# Patient Record
Sex: Male | Born: 1945 | Race: White | Hispanic: No | Marital: Married | Smoking: Never smoker
Health system: Southern US, Community
[De-identification: ages and names within clinical notes are randomized; demographics above are authoritative.]

## PROBLEM LIST (undated history)

## (undated) DIAGNOSIS — K760 Fatty (change of) liver, not elsewhere classified: Secondary | ICD-10-CM

## (undated) DIAGNOSIS — I10 Essential (primary) hypertension: Secondary | ICD-10-CM

## (undated) DIAGNOSIS — N400 Enlarged prostate without lower urinary tract symptoms: Secondary | ICD-10-CM

## (undated) DIAGNOSIS — E7800 Pure hypercholesterolemia, unspecified: Secondary | ICD-10-CM

## (undated) HISTORY — PX: APPENDECTOMY: SHX54

---

## 1998-02-16 ENCOUNTER — Ambulatory Visit (HOSPITAL_COMMUNITY): Admission: RE | Admit: 1998-02-16 | Discharge: 1998-02-16 | Payer: Self-pay | Admitting: Internal Medicine

## 1998-02-16 ENCOUNTER — Encounter: Payer: Self-pay | Admitting: Internal Medicine

## 2002-08-09 ENCOUNTER — Encounter: Admission: RE | Admit: 2002-08-09 | Discharge: 2002-08-09 | Payer: Self-pay | Admitting: Family Medicine

## 2002-08-09 ENCOUNTER — Encounter: Payer: Self-pay | Admitting: Family Medicine

## 2014-04-05 ENCOUNTER — Other Ambulatory Visit: Payer: Self-pay | Admitting: Family Medicine

## 2014-04-05 DIAGNOSIS — Z87891 Personal history of nicotine dependence: Secondary | ICD-10-CM

## 2014-04-05 DIAGNOSIS — Z136 Encounter for screening for cardiovascular disorders: Secondary | ICD-10-CM

## 2014-04-14 ENCOUNTER — Ambulatory Visit
Admission: RE | Admit: 2014-04-14 | Discharge: 2014-04-14 | Disposition: A | Discharge status: Home / Self Care | Payer: Self-pay | Source: Ambulatory Visit | Attending: Family Medicine | Admitting: Family Medicine

## 2014-04-14 ENCOUNTER — Encounter (INDEPENDENT_AMBULATORY_CARE_PROVIDER_SITE_OTHER): Payer: Self-pay

## 2014-04-14 DIAGNOSIS — Z136 Encounter for screening for cardiovascular disorders: Secondary | ICD-10-CM

## 2014-04-14 DIAGNOSIS — Z87891 Personal history of nicotine dependence: Secondary | ICD-10-CM

## 2015-10-02 ENCOUNTER — Encounter: Payer: Self-pay | Admitting: Gastroenterology

## 2016-09-08 DIAGNOSIS — R1011 Right upper quadrant pain: Secondary | ICD-10-CM | POA: Diagnosis not present

## 2016-09-10 ENCOUNTER — Encounter (HOSPITAL_COMMUNITY): Payer: Self-pay | Admitting: Emergency Medicine

## 2016-09-10 ENCOUNTER — Other Ambulatory Visit: Payer: Self-pay | Admitting: Family Medicine

## 2016-09-10 ENCOUNTER — Inpatient Hospital Stay (HOSPITAL_COMMUNITY)
Admission: EM | Admit: 2016-09-10 | Discharge: 2016-09-12 | DRG: 419 | Disposition: A | Discharge status: Home / Self Care | Payer: MEDICARE | Attending: Surgery | Admitting: Surgery

## 2016-09-10 ENCOUNTER — Ambulatory Visit
Admission: RE | Admit: 2016-09-10 | Discharge: 2016-09-10 | Disposition: A | Discharge status: Home / Self Care | Payer: Medicare HMO | Source: Ambulatory Visit | Attending: Family Medicine | Admitting: Family Medicine

## 2016-09-10 DIAGNOSIS — K660 Peritoneal adhesions (postprocedural) (postinfection): Secondary | ICD-10-CM | POA: Diagnosis present

## 2016-09-10 DIAGNOSIS — K8020 Calculus of gallbladder without cholecystitis without obstruction: Secondary | ICD-10-CM | POA: Diagnosis not present

## 2016-09-10 DIAGNOSIS — R402252 Coma scale, best verbal response, oriented, at arrival to emergency department: Secondary | ICD-10-CM | POA: Diagnosis not present

## 2016-09-10 DIAGNOSIS — K8012 Calculus of gallbladder with acute and chronic cholecystitis without obstruction: Secondary | ICD-10-CM | POA: Diagnosis not present

## 2016-09-10 DIAGNOSIS — K5900 Constipation, unspecified: Secondary | ICD-10-CM | POA: Diagnosis not present

## 2016-09-10 DIAGNOSIS — K760 Fatty (change of) liver, not elsewhere classified: Secondary | ICD-10-CM | POA: Diagnosis not present

## 2016-09-10 DIAGNOSIS — R1011 Right upper quadrant pain: Secondary | ICD-10-CM

## 2016-09-10 DIAGNOSIS — K810 Acute cholecystitis: Secondary | ICD-10-CM | POA: Diagnosis not present

## 2016-09-10 DIAGNOSIS — Z419 Encounter for procedure for purposes other than remedying health state, unspecified: Secondary | ICD-10-CM

## 2016-09-10 DIAGNOSIS — R402142 Coma scale, eyes open, spontaneous, at arrival to emergency department: Secondary | ICD-10-CM | POA: Diagnosis not present

## 2016-09-10 DIAGNOSIS — E7800 Pure hypercholesterolemia, unspecified: Secondary | ICD-10-CM

## 2016-09-10 DIAGNOSIS — K8000 Calculus of gallbladder with acute cholecystitis without obstruction: Secondary | ICD-10-CM

## 2016-09-10 DIAGNOSIS — I10 Essential (primary) hypertension: Secondary | ICD-10-CM | POA: Diagnosis not present

## 2016-09-10 DIAGNOSIS — Z9049 Acquired absence of other specified parts of digestive tract: Secondary | ICD-10-CM

## 2016-09-10 DIAGNOSIS — N400 Enlarged prostate without lower urinary tract symptoms: Secondary | ICD-10-CM | POA: Diagnosis not present

## 2016-09-10 DIAGNOSIS — R402362 Coma scale, best motor response, obeys commands, at arrival to emergency department: Secondary | ICD-10-CM | POA: Diagnosis present

## 2016-09-10 HISTORY — DX: Benign prostatic hyperplasia without lower urinary tract symptoms: N40.0

## 2016-09-10 HISTORY — DX: Pure hypercholesterolemia, unspecified: E78.00

## 2016-09-10 HISTORY — DX: Essential (primary) hypertension: I10

## 2016-09-10 HISTORY — DX: Fatty (change of) liver, not elsewhere classified: K76.0

## 2016-09-10 LAB — URINALYSIS, ROUTINE W REFLEX MICROSCOPIC
Bilirubin Urine: NEGATIVE
Glucose, UA: NEGATIVE mg/dL
Hgb urine dipstick: NEGATIVE
Ketones, ur: NEGATIVE mg/dL
Leukocytes, UA: NEGATIVE
Nitrite: NEGATIVE
Protein, ur: NEGATIVE mg/dL
Specific Gravity, Urine: 1.027 (ref 1.005–1.030)
pH: 5 (ref 5.0–8.0)

## 2016-09-10 LAB — COMPREHENSIVE METABOLIC PANEL
ALT: 30 U/L (ref 17–63)
AST: 32 U/L (ref 15–41)
Albumin: 4.1 g/dL (ref 3.5–5.0)
Alkaline Phosphatase: 81 U/L (ref 38–126)
Anion gap: 8 (ref 5–15)
BUN: 31 mg/dL — ABNORMAL HIGH (ref 6–20)
CO2: 28 mmol/L (ref 22–32)
Calcium: 9.1 mg/dL (ref 8.9–10.3)
Chloride: 98 mmol/L — ABNORMAL LOW (ref 101–111)
Creatinine, Ser: 1.24 mg/dL (ref 0.61–1.24)
GFR calc Af Amer: >60 mL/min (ref >60)
GFR calc non Af Amer: 58 mL/min — ABNORMAL LOW (ref >60)
Glucose, Bld: 120 mg/dL — ABNORMAL HIGH (ref 65–99)
Potassium: 3.3 mmol/L — ABNORMAL LOW (ref 3.5–5.1)
Sodium: 134 mmol/L — ABNORMAL LOW (ref 135–145)
Total Bilirubin: 2 mg/dL — ABNORMAL HIGH (ref 0.3–1.2)
Total Protein: 7.7 g/dL (ref 6.5–8.1)

## 2016-09-10 LAB — CBC
HCT: 42.4 % (ref 39.0–52.0)
Hemoglobin: 14.3 g/dL (ref 13.0–17.0)
MCH: 28.4 pg (ref 26.0–34.0)
MCHC: 33.7 g/dL (ref 30.0–36.0)
MCV: 84.1 fL (ref 78.0–100.0)
Platelets: 241 10*3/uL (ref 150–400)
RBC: 5.04 MIL/uL (ref 4.22–5.81)
RDW: 13.7 % (ref 11.5–15.5)
WBC: 17.2 10*3/uL — ABNORMAL HIGH (ref 4.0–10.5)

## 2016-09-10 LAB — LIPASE, BLOOD: Lipase: 34 U/L (ref 11–51)

## 2016-09-10 MED ORDER — DEXTROSE 5 % IV SOLN
2.0000 g | Freq: Once | INTRAVENOUS | Status: AC
  Administered 2016-09-10: 2 g via INTRAVENOUS
  Filled 2016-09-10: qty 2

## 2016-09-10 MED ORDER — ONDANSETRON HCL 4 MG/2ML IJ SOLN
4.0000 mg | Freq: Once | INTRAMUSCULAR | Status: AC
  Administered 2016-09-10: 4 mg via INTRAVENOUS
  Filled 2016-09-10: qty 2

## 2016-09-10 MED ORDER — MORPHINE SULFATE (PF) 4 MG/ML IV SOLN
4.0000 mg | Freq: Once | INTRAVENOUS | Status: AC
  Administered 2016-09-10: 4 mg via INTRAVENOUS
  Filled 2016-09-10: qty 1

## 2016-09-10 NOTE — H&P (Signed)
Re:   Alexander Glover DOB:   04/18/1946 MRN:   [id number]   Alexander Glover Admission  Chief Complaint Abdominal pain for 3 days  ASSESEMENT AND PLAN: 1.  Cholecystitis, cholelithiasis  I discussed with the patient the indications and risks of gall bladder surgery.  The primary risks of gall bladder surgery include, but are not limited to, bleeding, infection, common bile duct injury, and open surgery.  There is also the risk that the patient may have continued symptoms after surgery.  We discussed the typical post-operative recovery course. I tried to answer the patient's questions.  Dr. Michaell Glover is our surgeon of the week and I will discuss Alexander Glover with him in the AM.  2.  HTN 3.  BPH  Chief Complaint  Patient presents with  . Cholelithiasis   PHYSICIAN REQUESTING CONSULTATION:  Dr. Bruce Glover, [facility]  HISTORY OF PRESENT ILLNESS: Alexander Glover is a 71 y.o. (DOB: February 01, 1946)  white male whose primary care physician is Alexander Patella, MD and comes to the [facility] ER today for abdominal pain.  His wife, Alexander Glover, is at the bedside.   He has had occasional nausea and abdominal pain with trail mix, but this would last a few hours, then resolve.  On Saturday, 4/14, he had a grilled burger and some strawberry bread and developed a vague abdominal pain.  He did not feel well all night.  He went to [employer] on [address] on Sunday, 4/15.  He was told it was probably his gall bladder acting up and he was to contact his PCP Monday.  On Monday, 4/16, he called Dr. Benjaman Glover office, but he had no openings.  He called Dr. Benjaman Glover office again on Tuesday, 4/17, and was seen by Dr. Azucena Glover.  Dr. Azucena Glover obtained an [place] which showed gall stones and suggested that [name] go to the ER.  He has no fam history of gall bladder disease.    His only prior GI problem is that he had an appendectomy in the 1970's.  He gets a colonoscopy every 3 to 5 years - but cannot remember the name of the doctor who does the  colonoscopies.  [place] of abdomen - 09/10/2016 - Diffuse gallbladder wall thickening, with stones lodged at the neck of the gallbladder, measuring up to 9 mm in size. Positive ultrasonographic Murphy's sign limited. Findings are concerning for acute cholecystitis, with possible obstruction at the neck of the gallbladder.  WBC - 09/10/2016 - 17,200 T. Bili - 09/10/2016 - 2.0   History reviewed. No pertinent past medical history.   History reviewed. No pertinent surgical history.    Current Facility-Administered Medications  Medication Dose Route Frequency Provider Last Rate Last Dose  . cefTRIAXone (ROCEPHIN) 2 g in dextrose 5 % 50 mL IVPB  2 g Intravenous Once Alexander Nick, MD 100 mL/hr at 09/10/16 2250 2 g at 09/10/16 2250   Current Outpatient Prescriptions  Medication Sig Dispense Refill  . aspirin EC 81 MG tablet Take 81 mg by mouth daily.    . cloNIDine (CATAPRES) 0.1 MG tablet Take 0.1 mg by mouth daily.    . ondansetron (ZOFRAN-ODT) 4 MG disintegrating tablet Take 4-8 mg by mouth every 8 (eight) hours as needed for nausea or vomiting.    . simvastatin (ZOCOR) 40 MG tablet Take 40 mg by mouth at bedtime.    . tamsulosin (FLOMAX) 0.4 MG CAPS capsule Take 0.4 mg by mouth at bedtime.    . valsartan (DIOVAN) 320 MG  tablet Take 320 mg by mouth daily.       No Known Allergies  REVIEW OF SYSTEMS: Skin:  No history of rash.  No history of abnormal moles. Infection:  No history of hepatitis or HIV.  No history of MRSA. Neurologic:  No history of stroke.  No history of seizure.  No history of headaches. Cardiac:  HTN x 4 years Pulmonary:  Does not smoke cigarettes.  No asthma or bronchitis.  No OSA/CPAP.  Endocrine:  No diabetes. No thyroid disease. Gastrointestinal:  No history of stomach disease.  No history of liver disease.  No history of gall bladder disease.  No history of pancreas disease.  No history of colon disease. Urologic:  No history of kidney stones.  On  Flomax Musculoskeletal:  No history of joint or back disease. Hematologic:  No bleeding disorder.  No history of anemia.  Not anticoagulated. Psycho-social:  The patient is oriented.   The patient has no obvious psychologic or social impairment to understanding our conversation and plan.  SOCIAL and FAMILY HISTORY: Married. Wife, Alexander Glover, at the bedside. He is retired from [employer] x 10 years. He has 2 daughters who live in [place].  No family history of gall bladder disease.  PHYSICAL EXAM: BP (!) 151/89 (BP Location: Left Arm)   Pulse 78   Temp 98.2 F (36.8 C) (Oral)   Resp 18   SpO2 95%   General: WN WM who is alert and generally healthy appearing.  Skin:  Inspection and palpation - no mass or rash. Eyes:  Conjunctiva and lids unremarkable.            Pupils are equal Ears, Nose, Mouth, and Throat:  Ears and nose unremarkable            Lips and teeth are unremarable. Neck: Supple. No mass, trachea midline.  No thyroid mass. Lymph Nodes:  No supraclavicular, cervical, or inguinal nodes. Lungs: Normal respiratory effort.  Clear to auscultation and symmetric breath sounds. Heart:  Palpation of the heart is normal.            Auscultation: RRR. No murmur or rub.  Abdomen: No hernia.  He has a right lower abdominal scar.  He is distended with a tender RUQ.  Rectal: Not done. Musculoskeletal:              Good muscle strength and ROM  in upper and lower extremities. Neurologic:  Grossly intact to motor and sensory function. Psychiatric: Normal judgement and insight. Behavior is normal.            Oriented to time, person, place.   DATA REVIEWED, COUNSELING AND COORDINATION OF CARE: Epic notes reviewed. Counseling and coordination of care exceeded more than 50% of the time spent with patient. Total time spent with patient and charting: 40 minutes  Alexander Kin, MD,  [facility], [place] [address]  [address]   [place], [place]     [address] Phone:  [phone] FAX:  [phone]

## 2016-09-10 NOTE — ED Provider Notes (Signed)
[facility]-EMERGENCY DEPT Provider Note   CSN: [id number] Arrival date & time: 09/10/16  1803     History   Chief Complaint Chief Complaint  Patient presents with  . Cholelithiasis    HPI Alexander Glover is a 71 y.o. male.  71 year old male presents with several days of right upper quadrant abdominal pain that began after he ate a hamburger and strawberry cake. Pain is been persistent and colicky and localized to her upper quadrant without radiation. No fever, vomiting, diarrhea. Denies any urinary symptoms. Saw his physician who sent him for an outpatient ultrasound which showed cholecystitis. Was referred here for further management.      History reviewed. No pertinent past medical history.  There are no active problems to display for this patient.   History reviewed. No pertinent surgical history.     Home Medications    Prior to Admission medications   Not on File    Family History No family history on file.  Social History Social History  Substance Use Topics  . Smoking status: Never Smoker  . Smokeless tobacco: Never Used  . Alcohol use Not on file     Allergies   Patient has no known allergies.   Review of Systems Review of Systems  All other systems reviewed and are negative.    Physical Exam Updated Vital Signs BP (!) 151/89 (BP Location: Left Arm)   Pulse 78   Temp 98.2 F (36.8 C) (Oral)   Resp 18   SpO2 95%   Physical Exam  Constitutional: He is oriented to person, place, and time. He appears well-developed and well-nourished.  Non-toxic appearance. No distress.  HENT:  Head: Normocephalic and atraumatic.  Eyes: Conjunctivae, EOM and lids are normal. Pupils are equal, round, and reactive to light.  Neck: Normal range of motion. Neck supple. No tracheal deviation present. No thyroid mass present.  Cardiovascular: Normal rate, regular rhythm and normal heart sounds.  Exam reveals no gallop.   No murmur heard. Pulmonary/Chest:  Effort normal and breath sounds normal. No stridor. No respiratory distress. He has no decreased breath sounds. He has no wheezes. He has no rhonchi. He has no rales.  Abdominal: Soft. Normal appearance and bowel sounds are normal. He exhibits no distension. There is tenderness in the right upper quadrant. There is guarding. There is no rebound and no CVA tenderness.    Musculoskeletal: Normal range of motion. He exhibits no edema or tenderness.  Neurological: He is alert and oriented to person, place, and time. He has normal strength. No cranial nerve deficit or sensory deficit. GCS eye subscore is 4. GCS verbal subscore is 5. GCS motor subscore is 6.  Skin: Skin is warm and dry. No abrasion and no rash noted.  Psychiatric: He has a normal mood and affect. His speech is normal and behavior is normal.  Nursing note and vitals reviewed.    ED Treatments / Results  Labs (all labs ordered are listed, but only abnormal results are displayed) Labs Reviewed  COMPREHENSIVE METABOLIC PANEL - Abnormal; Notable for the following:       Result Value   Sodium 134 (*)    Potassium 3.3 (*)    Chloride 98 (*)    Glucose, Bld 120 (*)    BUN 31 (*)    Total Bilirubin 2.0 (*)    GFR calc non Af Amer 58 (*)    All other components within normal limits  CBC - Abnormal; Notable for the following:  WBC 17.2 (*)    All other components within normal limits  URINALYSIS, ROUTINE W REFLEX MICROSCOPIC - Abnormal; Notable for the following:    Color, Urine AMBER (*)    APPearance HAZY (*)    All other components within normal limits  LIPASE, BLOOD    EKG  EKG Interpretation None       Radiology US Abdomen Limited Ruq  Result Date: 09/10/2016 CLINICAL DATA:  Acute onset of right upper quadrant abdominal pain. Initial encounter. EXAM: US ABDOMEN LIMITED - RIGHT UPPER QUADRANT COMPARISON:  Right upper quadrant ultrasound performed 04/14/2014 FINDINGS: Gallbladder: Stones are lodged at the neck of  the gallbladder, measuring up to 9 mm in size. Diffuse gallbladder wall thickening is noted, with a positive ultrasonographic Murphy's sign, concerning for associated acute cholecystitis. No pericholecystic fluid is appreciated. Common bile duct: Diameter: 0.4 cm, within normal limits in caliber. Liver: No focal lesion identified. Within normal limits in parenchymal echogenicity. IMPRESSION: Diffuse gallbladder wall thickening, with stones lodged at the neck of the gallbladder, measuring up to 9 mm in size. Positive ultrasonographic Murphy's sign limited. Findings are concerning for acute cholecystitis, with possible obstruction at the neck of the gallbladder. Electronically Signed   By: Roanna Raider M.D.   On: 09/10/2016 16:29    Procedures Procedures (including critical care time)  Medications Ordered in ED Medications - No data to display   Initial Impression / Assessment and Plan / ED Course  I have reviewed the triage vital signs and the nursing notes.  Pertinent labs & imaging results that were available during my care of the patient were reviewed by me and considered in my medical decision making (see chart for details).     Patient with evidence of cholecystitis as well as leukocytosis on his CBC. Started on IV antibiotics given pain medication. Discussed with Dr. Ezzard Standing from general surgery will come and admit the patient.  Final Clinical Impressions(s) / ED Diagnoses   Final diagnoses:  None    New Prescriptions New Prescriptions   No medications on file     Lorre Nick, MD 09/10/16 2236

## 2016-09-10 NOTE — ED Triage Notes (Addendum)
Patient c/o sharp upper abdominal pain since Saturday. States he was seen by PCP and [place] results showed gallstones and elevated WBC. Denies N/V/D. Ambulatory to triage. Denies chest pain and SOB.

## 2016-09-11 ENCOUNTER — Inpatient Hospital Stay (HOSPITAL_COMMUNITY): Payer: MEDICARE | Admitting: Anesthesiology

## 2016-09-11 ENCOUNTER — Encounter (HOSPITAL_COMMUNITY): Admission: EM | Disposition: A | Discharge status: Home / Self Care | Payer: Self-pay | Source: Home / Self Care

## 2016-09-11 ENCOUNTER — Inpatient Hospital Stay (HOSPITAL_COMMUNITY): Payer: MEDICARE

## 2016-09-11 ENCOUNTER — Encounter (HOSPITAL_COMMUNITY): Payer: Self-pay

## 2016-09-11 DIAGNOSIS — N400 Enlarged prostate without lower urinary tract symptoms: Secondary | ICD-10-CM | POA: Diagnosis not present

## 2016-09-11 DIAGNOSIS — K8000 Calculus of gallbladder with acute cholecystitis without obstruction: Secondary | ICD-10-CM | POA: Diagnosis not present

## 2016-09-11 DIAGNOSIS — R402362 Coma scale, best motor response, obeys commands, at arrival to emergency department: Secondary | ICD-10-CM | POA: Diagnosis not present

## 2016-09-11 DIAGNOSIS — K5900 Constipation, unspecified: Secondary | ICD-10-CM | POA: Diagnosis not present

## 2016-09-11 DIAGNOSIS — R402142 Coma scale, eyes open, spontaneous, at arrival to emergency department: Secondary | ICD-10-CM | POA: Diagnosis not present

## 2016-09-11 DIAGNOSIS — Z9049 Acquired absence of other specified parts of digestive tract: Secondary | ICD-10-CM

## 2016-09-11 DIAGNOSIS — K8012 Calculus of gallbladder with acute and chronic cholecystitis without obstruction: Secondary | ICD-10-CM | POA: Diagnosis not present

## 2016-09-11 DIAGNOSIS — K760 Fatty (change of) liver, not elsewhere classified: Secondary | ICD-10-CM | POA: Diagnosis not present

## 2016-09-11 DIAGNOSIS — E7800 Pure hypercholesterolemia, unspecified: Secondary | ICD-10-CM | POA: Diagnosis not present

## 2016-09-11 DIAGNOSIS — R1011 Right upper quadrant pain: Secondary | ICD-10-CM | POA: Diagnosis present

## 2016-09-11 DIAGNOSIS — K660 Peritoneal adhesions (postprocedural) (postinfection): Secondary | ICD-10-CM | POA: Diagnosis not present

## 2016-09-11 DIAGNOSIS — R402252 Coma scale, best verbal response, oriented, at arrival to emergency department: Secondary | ICD-10-CM | POA: Diagnosis not present

## 2016-09-11 DIAGNOSIS — I10 Essential (primary) hypertension: Secondary | ICD-10-CM | POA: Diagnosis not present

## 2016-09-11 DIAGNOSIS — J869 Pyothorax without fistula: Secondary | ICD-10-CM | POA: Diagnosis not present

## 2016-09-11 HISTORY — DX: Fatty (change of) liver, not elsewhere classified: K76.0

## 2016-09-11 HISTORY — DX: Pure hypercholesterolemia, unspecified: E78.00

## 2016-09-11 HISTORY — DX: Benign prostatic hyperplasia without lower urinary tract symptoms: N40.0

## 2016-09-11 HISTORY — PX: LAPAROSCOPIC CHOLECYSTECTOMY SINGLE PORT: SHX5891

## 2016-09-11 LAB — COMPREHENSIVE METABOLIC PANEL
ALT: 47 U/L (ref 17–63)
AST: 55 U/L — ABNORMAL HIGH (ref 15–41)
Albumin: 3.5 g/dL (ref 3.5–5.0)
Alkaline Phosphatase: 117 U/L (ref 38–126)
Anion gap: 12 (ref 5–15)
BUN: 26 mg/dL — ABNORMAL HIGH (ref 6–20)
CO2: 26 mmol/L (ref 22–32)
Calcium: 8.7 mg/dL — ABNORMAL LOW (ref 8.9–10.3)
Chloride: 97 mmol/L — ABNORMAL LOW (ref 101–111)
Creatinine, Ser: 0.96 mg/dL (ref 0.61–1.24)
GFR calc Af Amer: >60 mL/min (ref >60)
GFR calc non Af Amer: >60 mL/min (ref >60)
Glucose, Bld: 137 mg/dL — ABNORMAL HIGH (ref 65–99)
Potassium: 3.3 mmol/L — ABNORMAL LOW (ref 3.5–5.1)
Sodium: 135 mmol/L (ref 135–145)
Total Bilirubin: 1.4 mg/dL — ABNORMAL HIGH (ref 0.3–1.2)
Total Protein: 6.8 g/dL (ref 6.5–8.1)

## 2016-09-11 LAB — MAGNESIUM: Magnesium: 2.1 mg/dL (ref 1.7–2.4)

## 2016-09-11 LAB — MRSA PCR SCREENING: MRSA by PCR: NEGATIVE

## 2016-09-11 SURGERY — LAPAROSCOPIC CHOLECYSTECTOMY SINGLE SITE
Anesthesia: General | Site: Abdomen

## 2016-09-11 MED ORDER — LIDOCAINE 2% (20 MG/ML) 5 ML SYRINGE
INTRAMUSCULAR | Status: DC | PRN
  Administered 2016-09-11: 100 mg via INTRAVENOUS

## 2016-09-11 MED ORDER — ONDANSETRON 4 MG PO TBDP: 4.0000 mg | ORAL_TABLET | Freq: Four times a day (QID) | ORAL | Status: DC | PRN

## 2016-09-11 MED ORDER — BISMUTH SUBSALICYLATE 262 MG/15ML PO SUSP
30.0000 mL | Freq: Three times a day (TID) | ORAL | Status: DC | PRN
Start: 2016-09-11 — End: 2016-09-12

## 2016-09-11 MED ORDER — ENOXAPARIN SODIUM 40 MG/0.4ML ~~LOC~~ SOLN
40.0000 mg | SUBCUTANEOUS | Status: DC
  Administered 2016-09-12: 40 mg via SUBCUTANEOUS
  Filled 2016-09-11: qty 0.4

## 2016-09-11 MED ORDER — PIPERACILLIN-TAZOBACTAM 3.375 G IVPB
3.3750 g | Freq: Three times a day (TID) | INTRAVENOUS | Status: DC
  Administered 2016-09-11 – 2016-09-12 (×2): 3.375 g via INTRAVENOUS
  Filled 2016-09-11 (×2): qty 50

## 2016-09-11 MED ORDER — METOPROLOL TARTRATE 5 MG/5ML IV SOLN: 5.0000 mg | Freq: Four times a day (QID) | INTRAVENOUS | Status: DC | PRN

## 2016-09-11 MED ORDER — KCL IN DEXTROSE-NACL 20-5-0.45 MEQ/L-%-% IV SOLN
INTRAVENOUS | Status: DC
  Administered 2016-09-11: 07:00:00 via INTRAVENOUS
  Filled 2016-09-11 (×2): qty 1000

## 2016-09-11 MED ORDER — METHOCARBAMOL 500 MG PO TABS: 500.0000 mg | ORAL_TABLET | Freq: Four times a day (QID) | ORAL | Status: DC | PRN

## 2016-09-11 MED ORDER — SUCCINYLCHOLINE CHLORIDE 200 MG/10ML IV SOSY
PREFILLED_SYRINGE | INTRAVENOUS | Status: DC | PRN
  Administered 2016-09-11: 140 mg via INTRAVENOUS

## 2016-09-11 MED ORDER — HYDROMORPHONE HCL 2 MG/ML IJ SOLN
0.5000 mg | INTRAMUSCULAR | Status: DC | PRN
  Administered 2016-09-11 (×2): 1 mg via INTRAVENOUS
  Filled 2016-09-11 (×2): qty 1

## 2016-09-11 MED ORDER — MAGNESIUM HYDROXIDE 400 MG/5ML PO SUSP: 30.0000 mL | Freq: Two times a day (BID) | ORAL | Status: DC | PRN

## 2016-09-11 MED ORDER — PIPERACILLIN-TAZOBACTAM 3.375 G IVPB
INTRAVENOUS | Status: AC
  Filled 2016-09-11: qty 50

## 2016-09-11 MED ORDER — LACTATED RINGERS IV BOLUS (SEPSIS): 1000.0000 mL | Freq: Three times a day (TID) | INTRAVENOUS | Status: DC | PRN

## 2016-09-11 MED ORDER — HYDRALAZINE HCL 20 MG/ML IJ SOLN: 5.0000 mg | INTRAMUSCULAR | Status: DC | PRN

## 2016-09-11 MED ORDER — ONDANSETRON HCL 4 MG/2ML IJ SOLN: 4.0000 mg | Freq: Four times a day (QID) | INTRAMUSCULAR | Status: DC | PRN

## 2016-09-11 MED ORDER — ENALAPRILAT 1.25 MG/ML IV SOLN
0.6250 mg | Freq: Four times a day (QID) | INTRAVENOUS | Status: DC | PRN
Start: 2016-09-11 — End: 2016-09-12

## 2016-09-11 MED ORDER — BUPIVACAINE HCL (PF) 0.25 % IJ SOLN
INTRAMUSCULAR | Status: AC
  Filled 2016-09-11: qty 30

## 2016-09-11 MED ORDER — PHENYLEPHRINE 40 MCG/ML (10ML) SYRINGE FOR IV PUSH (FOR BLOOD PRESSURE SUPPORT)
PREFILLED_SYRINGE | INTRAVENOUS | Status: DC | PRN
  Administered 2016-09-11: 120 ug via INTRAVENOUS
  Administered 2016-09-11: 80 ug via INTRAVENOUS
  Administered 2016-09-11: 120 ug via INTRAVENOUS

## 2016-09-11 MED ORDER — SODIUM CHLORIDE 0.9 % IR SOLN
Status: DC | PRN
  Administered 2016-09-11: 2000 mL

## 2016-09-11 MED ORDER — SIMETHICONE 80 MG PO CHEW: 40.0000 mg | CHEWABLE_TABLET | Freq: Four times a day (QID) | ORAL | Status: DC | PRN

## 2016-09-11 MED ORDER — HEPARIN SODIUM (PORCINE) 5000 UNIT/ML IJ SOLN
5000.0000 [IU] | Freq: Three times a day (TID) | INTRAMUSCULAR | Status: DC
  Administered 2016-09-11: 5000 [IU] via SUBCUTANEOUS
  Filled 2016-09-11 (×2): qty 1

## 2016-09-11 MED ORDER — IOPAMIDOL (ISOVUE-300) INJECTION 61%
INTRAVENOUS | Status: AC
  Filled 2016-09-11: qty 50

## 2016-09-11 MED ORDER — OXYCODONE HCL 5 MG PO TABS: 5.0000 mg | ORAL_TABLET | ORAL | 0 refills | Status: DC | PRN

## 2016-09-11 MED ORDER — TAMSULOSIN HCL 0.4 MG PO CAPS
0.4000 mg | ORAL_CAPSULE | Freq: Every day | ORAL | Status: DC
  Administered 2016-09-11: 0.4 mg via ORAL
  Filled 2016-09-11: qty 1

## 2016-09-11 MED ORDER — ALUM & MAG HYDROXIDE-SIMETH 200-200-20 MG/5ML PO SUSP: 30.0000 mL | Freq: Four times a day (QID) | ORAL | Status: DC | PRN

## 2016-09-11 MED ORDER — ROCURONIUM BROMIDE 50 MG/5ML IV SOSY
PREFILLED_SYRINGE | INTRAVENOUS | Status: AC
  Filled 2016-09-11: qty 5

## 2016-09-11 MED ORDER — METHOCARBAMOL 750 MG PO TABS: 750.0000 mg | ORAL_TABLET | Freq: Four times a day (QID) | ORAL | 2 refills | Status: DC | PRN

## 2016-09-11 MED ORDER — POTASSIUM CHLORIDE 10 MEQ/100ML IV SOLN
10.0000 meq | INTRAVENOUS | Status: AC
  Administered 2016-09-11 (×3): 10 meq via INTRAVENOUS
  Filled 2016-09-11 (×3): qty 100

## 2016-09-11 MED ORDER — SODIUM CHLORIDE 0.9% FLUSH
3.0000 mL | Freq: Two times a day (BID) | INTRAVENOUS | Status: DC
Start: 2016-09-11 — End: 2016-09-12

## 2016-09-11 MED ORDER — MIDAZOLAM HCL 2 MG/2ML IJ SOLN
INTRAMUSCULAR | Status: DC | PRN
  Administered 2016-09-11: 2 mg via INTRAVENOUS

## 2016-09-11 MED ORDER — LACTATED RINGERS IV SOLN: INTRAVENOUS | Status: AC

## 2016-09-11 MED ORDER — LACTATED RINGERS IV SOLN
INTRAVENOUS | Status: DC
  Administered 2016-09-11 (×2): via INTRAVENOUS

## 2016-09-11 MED ORDER — LIDOCAINE 2% (20 MG/ML) 5 ML SYRINGE
INTRAMUSCULAR | Status: AC
  Filled 2016-09-11: qty 5

## 2016-09-11 MED ORDER — IRBESARTAN 75 MG PO TABS
37.5000 mg | ORAL_TABLET | Freq: Every day | ORAL | Status: DC
Start: 2016-09-11 — End: 2016-09-11
  Administered 2016-09-11: 37.5 mg via ORAL
  Filled 2016-09-11: qty 1
  Filled 2016-09-11: qty 0.5

## 2016-09-11 MED ORDER — MIDAZOLAM HCL 2 MG/2ML IJ SOLN
INTRAMUSCULAR | Status: AC
  Filled 2016-09-11: qty 2

## 2016-09-11 MED ORDER — PIPERACILLIN-TAZOBACTAM 3.375 G IVPB
3.3750 g | Freq: Three times a day (TID) | INTRAVENOUS | Status: DC
Start: 2016-09-11 — End: 2016-09-11
  Administered 2016-09-11 (×2): 3.375 g via INTRAVENOUS
  Filled 2016-09-11 (×4): qty 50

## 2016-09-11 MED ORDER — MAGIC MOUTHWASH: 15.0000 mL | Freq: Four times a day (QID) | ORAL | Status: DC | PRN

## 2016-09-11 MED ORDER — IBUPROFEN 200 MG PO TABS: 400.0000 mg | ORAL_TABLET | Freq: Four times a day (QID) | ORAL | Status: DC | PRN

## 2016-09-11 MED ORDER — ONDANSETRON HCL 4 MG/2ML IJ SOLN
4.0000 mg | Freq: Four times a day (QID) | INTRAMUSCULAR | Status: DC | PRN
  Administered 2016-09-11: 4 mg via INTRAVENOUS
  Filled 2016-09-11: qty 2

## 2016-09-11 MED ORDER — IOPAMIDOL (ISOVUE-300) INJECTION 61%
INTRAVENOUS | Status: DC | PRN
  Administered 2016-09-11: 16:00:00

## 2016-09-11 MED ORDER — BUPIVACAINE-EPINEPHRINE 0.25% -1:200000 IJ SOLN
INTRAMUSCULAR | Status: DC | PRN
  Administered 2016-09-11: 50 mL

## 2016-09-11 MED ORDER — DIPHENHYDRAMINE HCL 12.5 MG/5ML PO ELIX: 12.5000 mg | ORAL_SOLUTION | Freq: Four times a day (QID) | ORAL | Status: DC | PRN

## 2016-09-11 MED ORDER — ACETAMINOPHEN 500 MG PO TABS
1000.0000 mg | ORAL_TABLET | Freq: Three times a day (TID) | ORAL | Status: DC
  Administered 2016-09-11 – 2016-09-12 (×2): 1000 mg via ORAL
  Filled 2016-09-11 (×2): qty 2

## 2016-09-11 MED ORDER — ONDANSETRON HCL 4 MG/2ML IJ SOLN
INTRAMUSCULAR | Status: AC
  Filled 2016-09-11: qty 2

## 2016-09-11 MED ORDER — FENTANYL CITRATE (PF) 100 MCG/2ML IJ SOLN
INTRAMUSCULAR | Status: AC
  Filled 2016-09-11: qty 2

## 2016-09-11 MED ORDER — PROCHLORPERAZINE EDISYLATE 5 MG/ML IJ SOLN: 5.0000 mg | INTRAMUSCULAR | Status: DC | PRN

## 2016-09-11 MED ORDER — POLYETHYLENE GLYCOL 3350 17 G PO PACK: 17.0000 g | PACK | Freq: Every day | ORAL | Status: DC | PRN

## 2016-09-11 MED ORDER — SODIUM CHLORIDE 0.9 % IV SOLN: 30.0000 meq | Freq: Once | INTRAVENOUS | Status: DC

## 2016-09-11 MED ORDER — ROCURONIUM BROMIDE 10 MG/ML (PF) SYRINGE
PREFILLED_SYRINGE | INTRAVENOUS | Status: DC | PRN
  Administered 2016-09-11: 40 mg via INTRAVENOUS
  Administered 2016-09-11: 10 mg via INTRAVENOUS

## 2016-09-11 MED ORDER — SUGAMMADEX SODIUM 200 MG/2ML IV SOLN
INTRAVENOUS | Status: DC | PRN
Start: 2016-09-11 — End: 2016-09-11
  Administered 2016-09-11: 200 mg via INTRAVENOUS

## 2016-09-11 MED ORDER — SODIUM CHLORIDE 0.9% FLUSH: 3.0000 mL | INTRAVENOUS | Status: DC | PRN

## 2016-09-11 MED ORDER — DEXAMETHASONE SODIUM PHOSPHATE 10 MG/ML IJ SOLN
INTRAMUSCULAR | Status: AC
  Filled 2016-09-11: qty 1

## 2016-09-11 MED ORDER — DEXAMETHASONE SODIUM PHOSPHATE 10 MG/ML IJ SOLN
INTRAMUSCULAR | Status: DC | PRN
  Administered 2016-09-11: 10 mg via INTRAVENOUS

## 2016-09-11 MED ORDER — PHENYLEPHRINE 40 MCG/ML (10ML) SYRINGE FOR IV PUSH (FOR BLOOD PRESSURE SUPPORT)
PREFILLED_SYRINGE | INTRAVENOUS | Status: AC
  Filled 2016-09-11: qty 10

## 2016-09-11 MED ORDER — CLONIDINE HCL 0.1 MG PO TABS
0.1000 mg | ORAL_TABLET | Freq: Every day | ORAL | Status: DC
  Administered 2016-09-11: 0.1 mg via ORAL
  Filled 2016-09-11: qty 1

## 2016-09-11 MED ORDER — FENTANYL CITRATE (PF) 250 MCG/5ML IJ SOLN
INTRAMUSCULAR | Status: AC
  Filled 2016-09-11: qty 5

## 2016-09-11 MED ORDER — BISACODYL 10 MG RE SUPP: 10.0000 mg | Freq: Two times a day (BID) | RECTAL | Status: DC | PRN

## 2016-09-11 MED ORDER — MENTHOL 3 MG MT LOZG: 1.0000 | LOZENGE | OROMUCOSAL | Status: DC | PRN

## 2016-09-11 MED ORDER — SIMVASTATIN 40 MG PO TABS
40.0000 mg | ORAL_TABLET | Freq: Every day | ORAL | Status: DC
  Administered 2016-09-11: 40 mg via ORAL
  Filled 2016-09-11: qty 2
  Filled 2016-09-11: qty 1

## 2016-09-11 MED ORDER — SODIUM CHLORIDE 0.9 % IV SOLN: 250.0000 mL | INTRAVENOUS | Status: DC | PRN

## 2016-09-11 MED ORDER — PHENOL 1.4 % MT LIQD
2.0000 | OROMUCOSAL | Status: DC | PRN
Start: 2016-09-11 — End: 2016-09-12

## 2016-09-11 MED ORDER — PROPOFOL 10 MG/ML IV BOLUS
INTRAVENOUS | Status: DC | PRN
  Administered 2016-09-11: 160 mg via INTRAVENOUS

## 2016-09-11 MED ORDER — PROPOFOL 10 MG/ML IV BOLUS
INTRAVENOUS | Status: AC
  Filled 2016-09-11: qty 20

## 2016-09-11 MED ORDER — LIP MEDEX EX OINT
1.0000 "application " | TOPICAL_OINTMENT | Freq: Two times a day (BID) | CUTANEOUS | Status: DC
  Administered 2016-09-12: 1 via TOPICAL

## 2016-09-11 MED ORDER — ASPIRIN EC 81 MG PO TBEC
81.0000 mg | DELAYED_RELEASE_TABLET | Freq: Every day | ORAL | Status: DC
  Administered 2016-09-12: 81 mg via ORAL
  Filled 2016-09-11: qty 1

## 2016-09-11 MED ORDER — FENTANYL CITRATE (PF) 100 MCG/2ML IJ SOLN: 25.0000 ug | INTRAMUSCULAR | Status: DC | PRN

## 2016-09-11 MED ORDER — DIPHENHYDRAMINE HCL 50 MG/ML IJ SOLN: 12.5000 mg | Freq: Four times a day (QID) | INTRAMUSCULAR | Status: DC | PRN

## 2016-09-11 MED ORDER — ONDANSETRON HCL 4 MG/2ML IJ SOLN
INTRAMUSCULAR | Status: DC | PRN
  Administered 2016-09-11: 4 mg via INTRAVENOUS

## 2016-09-11 MED ORDER — METOCLOPRAMIDE HCL 5 MG/ML IJ SOLN: 5.0000 mg | Freq: Four times a day (QID) | INTRAMUSCULAR | Status: DC | PRN

## 2016-09-11 MED ORDER — FENTANYL CITRATE (PF) 100 MCG/2ML IJ SOLN
INTRAMUSCULAR | Status: DC | PRN
  Administered 2016-09-11: 150 ug via INTRAVENOUS
  Administered 2016-09-11 (×4): 50 ug via INTRAVENOUS

## 2016-09-11 MED ORDER — MORPHINE SULFATE (PF) 4 MG/ML IV SOLN: 1.0000 mg | INTRAVENOUS | Status: DC | PRN

## 2016-09-11 MED ORDER — HYDROCODONE-ACETAMINOPHEN 5-325 MG PO TABS: 1.0000 | ORAL_TABLET | ORAL | Status: DC | PRN

## 2016-09-11 MED ORDER — OXYCODONE HCL 5 MG PO TABS: 5.0000 mg | ORAL_TABLET | ORAL | Status: DC | PRN

## 2016-09-11 MED ORDER — SUCCINYLCHOLINE CHLORIDE 200 MG/10ML IV SOSY
PREFILLED_SYRINGE | INTRAVENOUS | Status: AC
  Filled 2016-09-11: qty 10

## 2016-09-11 MED ORDER — PSYLLIUM 95 % PO PACK
1.0000 | PACK | Freq: Every day | ORAL | Status: DC
  Administered 2016-09-12: 1 via ORAL
  Filled 2016-09-11: qty 1

## 2016-09-11 SURGICAL SUPPLY — 42 items
ADH SKN CLS APL DERMABOND .7 (GAUZE/BANDAGES/DRESSINGS) ×1
APPLIER CLIP 5 13 M/L LIGAMAX5 (MISCELLANEOUS) ×2
BLADE CLIPPER SURG (BLADE) ×4 IMPLANT
CABLE HIGH FREQUENCY MONO STRZ (ELECTRODE) ×2 IMPLANT
CHLORAPREP W/TINT 26ML (MISCELLANEOUS) ×2 IMPLANT
CLIP APPLIE 5 13 M/L LIGAMAX5 (MISCELLANEOUS) ×1 IMPLANT
COVER MAYO STAND STRL (DRAPES) ×2 IMPLANT
COVER SURGICAL LIGHT HANDLE (MISCELLANEOUS) ×2 IMPLANT
DECANTER SPIKE VIAL GLASS SM (MISCELLANEOUS) ×2 IMPLANT
DERMABOND ADVANCED (GAUZE/BANDAGES/DRESSINGS) ×1
DERMABOND ADVANCED .7 DNX12 (GAUZE/BANDAGES/DRESSINGS) ×1 IMPLANT
DRAIN CHANNEL 19F RND (DRAIN) IMPLANT
DRAPE C-ARM 42X120 X-RAY (DRAPES) ×2 IMPLANT
DRAPE WARM FLUID 44X44 (DRAPE) ×2 IMPLANT
DRSG TEGADERM 4X4.75 (GAUZE/BANDAGES/DRESSINGS) ×2 IMPLANT
ELECT REM PT RETURN 15FT ADLT (MISCELLANEOUS) ×2 IMPLANT
ENDOLOOP SUT PDS II  0 18 (SUTURE) ×2
ENDOLOOP SUT PDS II 0 18 (SUTURE) ×2 IMPLANT
EVACUATOR SILICONE 100CC (DRAIN) IMPLANT
GAUZE SPONGE 2X2 8PLY STRL LF (GAUZE/BANDAGES/DRESSINGS) ×1 IMPLANT
GLOVE ECLIPSE 8.0 STRL XLNG CF (GLOVE) ×2 IMPLANT
GLOVE INDICATOR 8.0 STRL GRN (GLOVE) ×2 IMPLANT
GOWN STRL REUS W/TWL XL LVL3 (GOWN DISPOSABLE) ×4 IMPLANT
IRRIG SUCT STRYKERFLOW 2 WTIP (MISCELLANEOUS) ×2
IRRIGATION SUCT STRKRFLW 2 WTP (MISCELLANEOUS) ×1 IMPLANT
KIT BASIN OR (CUSTOM PROCEDURE TRAY) ×2 IMPLANT
PAD POSITIONING PINK XL (MISCELLANEOUS) ×2 IMPLANT
POSITIONER SURGICAL ARM (MISCELLANEOUS) ×2 IMPLANT
POUCH SPECIMEN RETRIEVAL 10MM (ENDOMECHANICALS) ×2 IMPLANT
SCISSORS LAP 5X35 DISP (ENDOMECHANICALS) ×2 IMPLANT
SET CHOLANGIOGRAPH MIX (MISCELLANEOUS) ×2 IMPLANT
SHEARS HARMONIC ACE PLUS 36CM (ENDOMECHANICALS) ×2 IMPLANT
SPONGE GAUZE 2X2 STER 10/PKG (GAUZE/BANDAGES/DRESSINGS) ×1
SUT MNCRL AB 4-0 PS2 18 (SUTURE) ×2 IMPLANT
SUT PDS AB 1 CT1 27 (SUTURE) ×8 IMPLANT
SYR 20CC LL (SYRINGE) ×2 IMPLANT
TOWEL OR 17X26 10 PK STRL BLUE (TOWEL DISPOSABLE) ×2 IMPLANT
TOWEL OR NON WOVEN STRL DISP B (DISPOSABLE) ×2 IMPLANT
TRAY LAPAROSCOPIC (CUSTOM PROCEDURE TRAY) ×2 IMPLANT
TROCAR BLADELESS OPT 5 100 (ENDOMECHANICALS) ×2 IMPLANT
TROCAR BLADELESS OPT 5 150 (ENDOMECHANICALS) ×2 IMPLANT
TUBING INSUF HEATED (TUBING) ×2 IMPLANT

## 2016-09-11 NOTE — Anesthesia Postprocedure Evaluation (Addendum)
Anesthesia Post Note  Patient: Alexander Glover  Procedure(s) Performed: Procedure(s) (LRB): LAPAROSCOPIC CHOLECYSTECTOMY SINGLE SITE (N/A)  Patient location during evaluation: PACU Anesthesia Type: General Level of consciousness: awake, awake and alert and oriented Pain management: pain level controlled Vital Signs Assessment: post-procedure vital signs reviewed and stable Respiratory status: spontaneous breathing, nonlabored ventilation and respiratory function stable Cardiovascular status: blood pressure returned to baseline Anesthetic complications: no       Last Vitals:  Vitals:   09/11/16 1730 09/11/16 1741  BP: (!) 164/88   Pulse: 87 87  Resp: 16 16  Temp: 37.3 C     Last Pain:  Vitals:   09/11/16 1741  TempSrc:   PainSc: 0-No pain                 JOSLIN,DAVID COKER

## 2016-09-11 NOTE — Anesthesia Procedure Notes (Signed)
Procedure Name: Intubation Date/Time: 09/11/2016 3:30 PM Performed by: Alexander Glover L Patient Re-evaluated:Patient Re-evaluated prior to inductionOxygen Delivery Method: Circle system utilized Preoxygenation: Pre-oxygenation with 100% oxygen Intubation Type: IV induction Ventilation: Mask ventilation without difficulty and Oral airway inserted - appropriate to patient size Laryngoscope Size: Miller and 3 Grade View: Grade II Tube size: 8.0 mm Number of attempts: 1 Airway Equipment and Method: Stylet Placement Confirmation: ETT inserted through vocal cords under direct vision,  positive ETCO2 and breath sounds checked- equal and bilateral Secured at: 22 cm Tube secured with: Tape Dental Injury: Teeth and Oropharynx as per pre-operative assessment

## 2016-09-11 NOTE — Progress Notes (Signed)
I have re-reviewed the the patient's records, history, medications, and allergies.  I have re-examined the patient.  I again discussed intraoperative plans and goals of post-operative recovery.  The patient agrees to proceed.  Alexander Glover  11-17-1945 [id number]  Patient Care Team: Maury Dus, MD as PCP - General (Family Medicine)  Patient Active Problem List   Diagnosis Date Noted  . Acute calculous cholecystitis 09/11/2016  . S/P laparoscopic cholecystectomy 09/11/2016    History reviewed. No pertinent past medical history.  History reviewed. No pertinent surgical history.  Social History   Social History  . Marital status: Married    Spouse name: N/A  . Number of children: N/A  . Years of education: N/A   Occupational History  . Not on file.   Social History Main Topics  . Smoking status: Never Smoker  . Smokeless tobacco: Never Used  . Alcohol use Not on file  . Drug use: Unknown  . Sexual activity: Not on file   Other Topics Concern  . Not on file   Social History Narrative  . No narrative on file    No family history on file.  Current Facility-Administered Medications  Medication Dose Route Frequency Provider Last Rate Last Dose  . cloNIDine (CATAPRES) tablet 0.1 mg  0.1 mg Oral Daily Alphonsa Overall, MD   0.1 mg at 09/11/16 1009  . dextrose 5 % and 0.45 % NaCl with KCl 20 mEq/L infusion   Intravenous Continuous Alphonsa Overall, MD   Stopped at 09/11/16 1417  . heparin injection 5,000 Units  5,000 Units Subcutaneous Q8H Alphonsa Overall, MD   5,000 Units at 09/11/16 [phone]  . HYDROcodone-acetaminophen (NORCO/VICODIN) 5-325 MG per tablet 1-2 tablet  1-2 tablet Oral Q4H PRN Alphonsa Overall, MD      . irbesartan (AVAPRO) tablet 37.5 mg  37.5 mg Oral Daily Alphonsa Overall, MD   37.5 mg at 09/11/16 1009  . morphine 4 MG/ML injection 1-3 mg  1-3 mg Intravenous Q2H PRN Alphonsa Overall, MD      . ondansetron (ZOFRAN-ODT) disintegrating tablet 4 mg  4 mg Oral Q6H PRN Alphonsa Overall,  MD       Or  . ondansetron [facility]) injection 4 mg  4 mg Intravenous Q6H PRN Alphonsa Overall, MD      . piperacillin-tazobactam (ZOSYN) IVPB 3.375 g  3.375 g Intravenous Q8H Alphonsa Overall, MD   Stopped at 09/11/16 [phone]     No Known Allergies  BP (!) 156/73 (BP Location: Right Arm)   Pulse 80   Temp 97.4 F (36.3 C) (Oral)   Resp 18   Ht '5\' 8"'$  (1.727 m)   Wt 89.3 kg (196 lb 13.9 oz)   SpO2 92%   BMI 29.93 kg/m   Labs: Results for orders placed or performed during the hospital encounter of 09/10/16 (from the past 48 hour(s))  Lipase, blood     Status: None   Collection Time: 09/10/16  8:58 PM  Result Value Ref Range   Lipase 34 11 - 51 U/L  Comprehensive metabolic panel     Status: Abnormal   Collection Time: 09/10/16  8:58 PM  Result Value Ref Range   Sodium 134 (L) 135 - 145 mmol/L   Potassium 3.3 (L) 3.5 - 5.1 mmol/L   Chloride 98 (L) 101 - 111 mmol/L   CO2 28 22 - 32 mmol/L   Glucose, Bld 120 (H) 65 - 99 mg/dL   BUN 31 (H) 6 - 20 mg/dL   Creatinine,  Ser 1.24 0.61 - 1.24 mg/dL   Calcium 9.1 8.9 - 10.3 mg/dL   Total Protein 7.7 6.5 - 8.1 g/dL   Albumin 4.1 3.5 - 5.0 g/dL   AST 32 15 - 41 U/L   ALT 30 17 - 63 U/L   Alkaline Phosphatase 81 38 - 126 U/L   Total Bilirubin 2.0 (H) 0.3 - 1.2 mg/dL   GFR calc non Af Amer 58 (L) >60 mL/min   GFR calc Af Amer >60 >60 mL/min    Comment: (NOTE) The eGFR has been calculated using the CKD EPI equation. This calculation has not been validated in all clinical situations. eGFR's persistently <60 mL/min signify possible Chronic Kidney Disease.    Anion gap 8 5 - 15  CBC     Status: Abnormal   Collection Time: 09/10/16  8:58 PM  Result Value Ref Range   WBC 17.2 (H) 4.0 - 10.5 K/uL   RBC 5.04 4.22 - 5.81 MIL/uL   Hemoglobin 14.3 13.0 - 17.0 g/dL   HCT 42.4 39.0 - 52.0 %   MCV 84.1 78.0 - 100.0 fL   MCH 28.4 26.0 - 34.0 pg   MCHC 33.7 30.0 - 36.0 g/dL   RDW 13.7 11.5 - 15.5 %   Platelets 241 150 - 400 K/uL  Urinalysis, Routine  w reflex microscopic     Status: Abnormal   Collection Time: 09/10/16  9:34 PM  Result Value Ref Range   Color, Urine AMBER (A) YELLOW    Comment: BIOCHEMICALS MAY BE AFFECTED BY COLOR   APPearance HAZY (A) CLEAR   Specific Gravity, Urine 1.027 1.005 - 1.030   pH 5.0 5.0 - 8.0   Glucose, UA NEGATIVE NEGATIVE mg/dL   Hgb urine dipstick NEGATIVE NEGATIVE   Bilirubin Urine NEGATIVE NEGATIVE   Ketones, ur NEGATIVE NEGATIVE mg/dL   Protein, ur NEGATIVE NEGATIVE mg/dL   Nitrite NEGATIVE NEGATIVE   Leukocytes, UA NEGATIVE NEGATIVE  Comprehensive metabolic panel     Status: Abnormal   Collection Time: 09/11/16  9:12 AM  Result Value Ref Range   Sodium 135 135 - 145 mmol/L   Potassium 3.3 (L) 3.5 - 5.1 mmol/L   Chloride 97 (L) 101 - 111 mmol/L   CO2 26 22 - 32 mmol/L   Glucose, Bld 137 (H) 65 - 99 mg/dL   BUN 26 (H) 6 - 20 mg/dL   Creatinine, Ser 0.96 0.61 - 1.24 mg/dL   Calcium 8.7 (L) 8.9 - 10.3 mg/dL   Total Protein 6.8 6.5 - 8.1 g/dL   Albumin 3.5 3.5 - 5.0 g/dL   AST 55 (H) 15 - 41 U/L   ALT 47 17 - 63 U/L   Alkaline Phosphatase 117 38 - 126 U/L   Total Bilirubin 1.4 (H) 0.3 - 1.2 mg/dL   GFR calc non Af Amer >60 >60 mL/min   GFR calc Af Amer >60 >60 mL/min    Comment: (NOTE) The eGFR has been calculated using the CKD EPI equation. This calculation has not been validated in all clinical situations. eGFR's persistently <60 mL/min signify possible Chronic Kidney Disease.    Anion gap 12 5 - 15  Magnesium     Status: None   Collection Time: 09/11/16  9:12 AM  Result Value Ref Range   Magnesium 2.1 1.7 - 2.4 mg/dL  MRSA PCR Screening     Status: None   Collection Time: 09/11/16 12:20 PM  Result Value Ref Range   MRSA by PCR NEGATIVE NEGATIVE  Comment:        The GeneXpert MRSA Assay (FDA approved for NASAL specimens only), is one component of a comprehensive MRSA colonization surveillance program. It is not intended to diagnose MRSA infection nor to guide or monitor  treatment for MRSA infections.     Imaging / Studies: US Abdomen Limited Ruq  Result Date: 09/10/2016 CLINICAL DATA:  Acute onset of right upper quadrant abdominal pain. Initial encounter. EXAM: US ABDOMEN LIMITED - RIGHT UPPER QUADRANT COMPARISON:  Right upper quadrant ultrasound performed 04/14/2014 FINDINGS: Gallbladder: Stones are lodged at the neck of the gallbladder, measuring up to 9 mm in size. Diffuse gallbladder wall thickening is noted, with a positive ultrasonographic Murphy's sign, concerning for associated acute cholecystitis. No pericholecystic fluid is appreciated. Common bile duct: Diameter: 0.4 cm, within normal limits in caliber. Liver: No focal lesion identified. Within normal limits in parenchymal echogenicity. IMPRESSION: Diffuse gallbladder wall thickening, with stones lodged at the neck of the gallbladder, measuring up to 9 mm in size. Positive ultrasonographic Murphy's sign limited. Findings are concerning for acute cholecystitis, with possible obstruction at the neck of the gallbladder. Electronically Signed   By: Garald Balding M.D.   On: 09/10/2016 16:29     .Adin Hector, M.D., F.A.C.S. Gastrointestinal and Minimally Invasive Surgery [facility] [address] [place], [place] [address] [phone] Main / Paging  09/11/2016 2:43 PM

## 2016-09-11 NOTE — Anesthesia Preprocedure Evaluation (Addendum)
Anesthesia Evaluation  Patient identified by MRN, date of birth, ID band Patient awake    Reviewed: Allergy & Precautions, NPO status , Patient's Chart, lab work & pertinent test results  Airway Mallampati: II  TM Distance: >3 FB     Dental   Pulmonary neg pulmonary ROS,    breath sounds clear to auscultation       Cardiovascular hypertension,  Rhythm:Regular Rate:Normal     Neuro/Psych    GI/Hepatic Neg liver ROS, GI history noted. CG   Endo/Other  negative endocrine ROS  Renal/GU negative Renal ROS     Musculoskeletal   Abdominal   Peds  Hematology   Anesthesia Other Findings   Reproductive/Obstetrics                            Anesthesia Physical Anesthesia Plan  ASA: III  Anesthesia Plan:    Post-op Pain Management:    Induction: Intravenous  Airway Management Planned: Oral ETT  Additional Equipment:   Intra-op Plan:   Post-operative Plan: Extubation in OR  Informed Consent: I have reviewed the patients History and Physical, chart, labs and discussed the procedure including the risks, benefits and alternatives for the proposed anesthesia with the patient or authorized representative who has indicated his/her understanding and acceptance.   Dental advisory given  Plan Discussed with: CRNA and Anesthesiologist  Anesthesia Plan Comments:        Anesthesia Quick Evaluation

## 2016-09-11 NOTE — Discharge Instructions (Signed)
LAPAROSCOPIC SURGERY: POST OP INSTRUCTIONS ° °###################################################################### ° °EAT °Gradually transition to a high fiber diet with a fiber supplement over the next few weeks after discharge.  Start with a pureed / full liquid diet (see below) ° °WALK °Walk an hour a day.  Control your pain to do that.   ° °CONTROL PAIN °Control pain so that you can walk, sleep, tolerate sneezing/coughing, go up/down stairs. ° °HAVE A BOWEL MOVEMENT DAILY °Keep your bowels regular to avoid problems.  OK to try a laxative to override constipation.  OK to use an antidairrheal to slow down diarrhea.  Call if not better after 2 tries ° °CALL IF YOU HAVE PROBLEMS/CONCERNS °Call if you are still struggling despite following these instructions. °Call if you have concerns not answered by these instructions ° °###################################################################### ° ° ° °1. DIET: Follow a light bland diet the first 24 hours after arrival home, such as soup, liquids, crackers, etc.  Be sure to include lots of fluids daily.  Avoid fast food or heavy meals as your are more likely to get nauseated.  Eat a low fat the next few days after surgery.   °2. Take your usually prescribed home medications unless otherwise directed. °3. PAIN CONTROL: °a. Pain is best controlled by a usual combination of three different methods TOGETHER: °i. Ice/Heat °ii. Over the counter pain medication °iii. Prescription pain medication °b. Most patients will experience some swelling and bruising around the incisions.  Ice packs or heating pads (30-60 minutes up to 6 times a day) will help. Use ice for the first few days to help decrease swelling and bruising, then switch to heat to help relax tight/sore spots and speed recovery.  Some people prefer to use ice alone, heat alone, alternating between ice & heat.  Experiment to what works for you.  Swelling and bruising can take several weeks to resolve.   °c. It is  helpful to take an over-the-counter pain medication regularly for the first few weeks.  Choose one of the following that works best for you: °i. Naproxen (Aleve, etc)  Two 220mg tabs twice a day °ii. Ibuprofen (Advil, etc) Three 200mg tabs four times a day (every meal & bedtime) °iii. Acetaminophen (Tylenol, etc) 500-650mg four times a day (every meal & bedtime) °d. A  prescription for pain medication (such as oxycodone, hydrocodone, etc) should be given to you upon discharge.  Take your pain medication as prescribed.  °i. If you are having problems/concerns with the prescription medicine (does not control pain, nausea, vomiting, rash, itching, etc), please call us [phone] to see if we need to switch you to a different pain medicine that will work better for you and/or control your side effect better. °ii. If you need a refill on your pain medication, please contact your pharmacy.  They will contact our office to request authorization. Prescriptions will not be filled after 5 pm or on week-ends. °4. Avoid getting constipated.  Between the surgery and the pain medications, it is common to experience some constipation.  Increasing fluid intake and taking a fiber supplement (such as Metamucil, Citrucel, FiberCon, MiraLax, etc) 1-2 times a day regularly will usually help prevent this problem from occurring.  A mild laxative (prune juice, Milk of Magnesia, MiraLax, etc) should be taken according to package directions if there are no bowel movements after 48 hours.   °5. Watch out for diarrhea.  If you have many loose bowel movements, simplify your diet to bland foods & liquids for   a few days.  Stop any stool softeners and decrease your fiber supplement.  Switching to mild anti-diarrheal medications (Kayopectate, Pepto Bismol) can help.  If this worsens or does not improve, please call us. °6. Wash / shower every day.  You may shower over the dressings as they are waterproof.  Continue to shower over incision(s)  after the dressing is off. °7. Remove your waterproof bandages 5 days after surgery.  You may leave the incision open to air.  You may replace a dressing/Band-Aid to cover the incision for comfort if you wish.  °8. ACTIVITIES as tolerated:   °a. You may resume regular (light) daily activities beginning the next day--such as daily self-care, walking, climbing stairs--gradually increasing activities as tolerated.  If you can walk 30 minutes without difficulty, it is safe to try more intense activity such as jogging, treadmill, bicycling, low-impact aerobics, swimming, etc. °b. Save the most intensive and strenuous activity for last such as sit-ups, heavy lifting, contact sports, etc  Refrain from any heavy lifting or straining until you are off narcotics for pain control.   °c. DO NOT PUSH THROUGH PAIN.  Let pain be your guide: If it hurts to do something, don't do it.  Pain is your body warning you to avoid that activity for another week until the pain goes down. °d. You may drive when you are no longer taking prescription pain medication, you can comfortably wear a seatbelt, and you can safely maneuver your car and apply brakes. °e. You may have sexual intercourse when it is comfortable.  °9. FOLLOW UP in our office °a. Please call [facility] at [phone] to set up an appointment to see your surgeon in the office for a follow-up appointment approximately 2-3 weeks after your surgery. °b. Make sure that you call for this appointment the day you arrive home to insure a convenient appointment time. °10. IF YOU HAVE DISABILITY OR FAMILY LEAVE FORMS, BRING THEM TO THE OFFICE FOR PROCESSING.  DO NOT GIVE THEM TO YOUR DOCTOR. ° ° °WHEN TO CALL US [phone]: °1. Poor pain control °2. Reactions / problems with new medications (rash/itching, nausea, etc)  °3. Fever over 101.5 F (38.5 C) °4. Inability to urinate °5. Nausea and/or vomiting °6. Worsening swelling or bruising °7. Continued bleeding from incision. °8. Increased  pain, redness, or drainage from the incision ° ° The clinic staff is available to answer your questions during regular business hours (8:30am-5pm).  Please don’t hesitate to call and ask to speak to one of our nurses for clinical concerns.  ° If you have a medical emergency, go to the nearest emergency room or call 911. ° A surgeon from [facility] is always on call at the hospitals ° ° °[facility] °[address], [address], [place], [place]  [address] ? °MAIN: [phone] ? TOLL FREE: [phone] ?  °FAX [phone] °[email or web address] ° ° ° °Cholecystitis °Cholecystitis is inflammation of the gallbladder. It is often called a gallbladder attack. The gallbladder is a pear-shaped organ that lies beneath the liver on the right side of the body. The gallbladder stores bile, which is a fluid that helps the body to digest fats. If bile builds up in your gallbladder, your gallbladder becomes inflamed. This condition may occur suddenly (be acute). Repeat episodes of acute cholecystitis or prolonged episodes may lead to a long-term (chronic) condition. Cholecystitis is serious and it requires treatment. °What are the causes? °The most common cause of this   condition is gallstones. Gallstones can block the tube (duct) that carries bile out of your gallbladder. This causes bile to build up. Other causes of this condition include: °· Damage to the gallbladder due to a decrease in blood flow. °· Infections in the bile ducts. °· Scars or kinks in the bile ducts. °· Tumors in the liver, pancreas, or gallbladder. ° °What increases the risk? °This condition is more likely to develop in: °· People who have sickle cell disease. °· People who take birth control pills or use estrogen. °· People who have alcoholic liver disease. °· People who have liver cirrhosis. °· People who have their nutrition delivered through a vein (parenteral nutrition). °· People who do not eat or drink  (do fasting) for a long period of time. °· People who are obese. °· People who have rapid weight loss. °· People who are pregnant. °· People who have increased triglyceride levels. °· People who have pancreatitis. ° °What are the signs or symptoms? °Symptoms of this condition include: °· Abdominal pain, especially in the upper right area of the abdomen. °· Abdominal tenderness or bloating. °· Nausea. °· Vomiting. °· Fever. °· Chills. °· Yellowing of the skin and the whites of the eyes (jaundice). ° °How is this diagnosed? °This condition is diagnosed with a medical history and physical exam. You may also have other tests, including: °· Imaging tests, such as: °? An ultrasound of the gallbladder. °? A CT scan of the abdomen. °? A gallbladder nuclear scan (HIDA scan). This scan allows your health care provider to see the bile moving from your liver to your gallbladder and to your small intestine. °? MRI. °· Blood tests, such as: °? A complete blood count, because the white blood cell count may be higher than normal. °? Liver function tests, because some levels may be higher than normal with certain types of gallstones. ° °How is this treated? °Treatment may include: °· Fasting for a certain amount of time. °· IV fluids. °· Medicine to treat pain or vomiting. °· Antibiotic medicine. °· Surgery to remove your gallbladder (cholecystectomy). This may happen immediately or at a later time. ° °Follow these instructions at home: °Home care will depend on your treatment. In general: °· Take over-the-counter and prescription medicines only as told by your health care provider. °· If you were prescribed an antibiotic medicine, take it as told by your health care provider. Do not stop taking the antibiotic even if you start to feel better. °· Follow instructions from your health care provider about what to eat or drink. When you are allowed to eat, avoid eating or drinking anything that triggers your symptoms. °· Keep all  follow-up visits as told by your health care provider. This is important. ° °Contact a health care provider if: °· Your pain is not controlled with medicine. °· You have a fever. °Get help right away if: °· Your pain moves to another part of your abdomen or to your back. °· You continue to have symptoms or you develop new symptoms even with treatment. °This information is not intended to replace advice given to you by your health care provider. Make sure you discuss any questions you have with your health care provider. °Document Released: 05/13/2005 Document Revised: 09/21/2015 Document Reviewed: 08/24/2014 °Elsevier Interactive Patient Education © 2017 [employer] ° °

## 2016-09-11 NOTE — Op Note (Signed)
09/11/2016  5:01 PM  PATIENT:  Alexander Glover  71 y.o. male  Patient Care Team: Elias Else, MD as PCP - General (Family Medicine) Karie Soda, MD as Consulting Physician (General Surgery)  PRE-OPERATIVE DIAGNOSIS:    Acute calculus cholecystitis   POST-OPERATIVE DIAGNOSIS:    Acute calculus cholecystitis Empyema of gallbladder Fatty change of liver.  PROCEDURE:  LAPAROSCOPIC CHOLECYSTECTOMY SINGLE SITE  SURGEON:  Ardeth Sportsman, MD  ASSISTANT: RN   ANESTHESIA:   local and general  EBL:  Total I/O In: 1450 [I.V.:1400; IV Piggyback:50] Out: 75 [Blood:75]  Delay start of Pharmacological VTE agent (>24hrs) due to surgical blood loss or risk of bleeding:  no  DRAINS: none   SPECIMEN:  Gallbladder   DISPOSITION OF SPECIMEN:  PATHOLOGY  COUNTS:  YES  PLAN OF CARE: Admit for overnight observation  PATIENT DISPOSITION:  PACU - hemodynamically stable.  INDICATION: Patient with episode of right upper quadrant pain and biliary colic persistent with Eulah Pont sign and gallbladder wall edema strongly suspicious for acute cholecystitis.  Admitted.  Placed on IV antibiotic.  Recommendation made for cholecystectomy.  The anatomy & physiology of hepatobiliary & pancreatic function was discussed.  The pathophysiology of gallbladder dysfunction was discussed.  Natural history risks without surgery was discussed.   I feel the risks of no intervention will lead to serious problems that outweigh the operative risks; therefore, I recommended cholecystectomy to remove the pathology.  I explained laparoscopic techniques with possible need for an open approach.  Probable cholangiogram to evaluate the bilary tract was explained as well.    Risks such as bleeding, infection, abscess, leak, injury to other organs, need for further treatment, heart attack, death, and other risks were discussed.  I noted a good likelihood this will help address the problem.  Possibility that this will not  correct all abdominal symptoms was explained.  Goals of post-operative recovery were discussed as well.  We will work to minimize complications.  An educational handout further explaining the pathology and treatment options was given as well.  Questions were answered.  The patient expresses understanding & wishes to proceed with surgery.  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  Purulent bile consistent with empyema.  Moderate adhesions.  Primarily inflammatory.  Shortened cystic duct.  Inflamed poor tissues.  Held off on cholangiogram.  Fatty change of the liver.  Not perfectly severe though.  DESCRIPTION:   The patient was identified & brought in the operating room. The patient was positioned supine with arms tucked. SCDs were active during the entire case. The patient underwent general anesthesia without any difficulty.  The abdomen was prepped and draped in a sterile fashion. A Surgical Timeout confirmed our plan.  I made a transverse curvilinear incision through the superior umbilical fold.  I placed a 5mm long port through the supraumbilical fascia using a modified Hassan cutdown technique. I began carbon dioxide insufflation. Camera inspection revealed no injury. There were no adhesions to the anterior abdominal wall supraumbilically.  I proceeded to continue with single site technique. I placed a #5 port in left upper aspect of the wound. I placed a 5 mm atraumatic grasper in the right inferior aspect of the wound.  I turned attention to the right upper quadrant.   Dense omental inflammatory adhesions are freed off the liver edge to expose a distended and inflamed gallbladder.  I was able to partially elevated but could not grab it. Aspiration was done of  purulent bile.  The gallbladder fundus was elevated cephalad. I freed the peritoneal coverings between the gallbladder and the liver on the posteriolateral and anteriomedial walls.   This  quite thickened and inflamed.  A transition to a dome down approach to get the gallbladder off the liver fossa to come down to a twisted infundibulum. I alternated between Harmonic & blunt Maryland dissection to help get a good critical view of the cystic artery and cystic duct. I  mobilized the cystic artery; and, after getting a good 360 view, ligated the cystic artery using two clips and Harmonic ultrasonic dissection.  I assured hemostasis on the gallbladder fossa of the liver bed.  This just left the cystic duct coming off the infundibulum of the gallbladder.  The gallbladder had been completely freed off the liver.   stomach was very short and inflamed.  There is about just 15 mm before it went into the common bile duct.  I decided not to do cholangiography since I had the ultimate critical view of the cystic duct. I ligated the cystic duct stump using a 0 PDS Endoloop 2.  I placed two clips on it closer to the infundibulum as well.   I completed cystic duct transection.  I ensured hemostasis on the gallbladder fossa of the liver and elsewhere. did copious irrigation of several liters of saline with good clear return.  Cystic duct ligation stump was intact.  No leak of bile or liver bed or at porta hepatis.  The area cleaned up well, so I did not leave a drain.  Plastic gallbladder inside an Endo Catch bag.  I had opened up the supraumbilical fascia transversely to 3 cm to get the very thick-walled gallbladder with its large gallstones out.  I closed the fascia transversely using #1 PDS interrupted stitches. I closed the skin using 4-0 monocryl stitch.  Sterile dressing was applied. The patient was extubated & arrived in the PACU in stable condition..  I had discussed postoperative care with the patient in the holding area.  I am about to locate the patient's  family and discuss operative findings and postoperative goals / instructions.  Instructions are written in the chart as well.  Ardeth Sportsman, M.D., F.A.C.S. Gastrointestinal and Minimally Invasive Surgery [facility] [address] [place], [place] [address] [phone] Main / Paging

## 2016-09-11 NOTE — Transfer of Care (Signed)
Immediate Anesthesia Transfer of Care Note  Patient: Alexander Glover  Procedure(s) Performed: Procedure(s): LAPAROSCOPIC CHOLECYSTECTOMY SINGLE SITE (N/A)  Patient Location: PACU  Anesthesia Type:General  Level of Consciousness: awake, alert , oriented and patient cooperative  Airway & Oxygen Therapy: Patient Spontanous Breathing and Patient connected to face mask oxygen  Post-op Assessment: Report given to RN and Post -op Vital signs reviewed and stable  Post vital signs: Reviewed and stable  Last Vitals:  Vitals:   09/11/16 1011 09/11/16 1403  BP: (!) 159/79 (!) 156/73  Pulse: 72 80  Resp: 18 18  Temp: 37 C 36.3 C    Last Pain:  Vitals:   09/11/16 1505  TempSrc:   PainSc: 0-No pain      Patients Stated Pain Goal: 2 (09/11/16 1505)  Complications: No apparent anesthesia complications

## 2016-09-12 ENCOUNTER — Encounter (HOSPITAL_COMMUNITY): Payer: Self-pay | Admitting: Surgery

## 2016-09-12 MED ORDER — POLYETHYLENE GLYCOL 3350 17 G PO PACK: 17.0000 g | PACK | Freq: Every day | ORAL | Status: DC | PRN

## 2016-09-12 MED ORDER — IRBESARTAN 75 MG PO TABS: 37.5000 mg | ORAL_TABLET | Freq: Every day | ORAL | Status: DC

## 2016-09-12 MED ORDER — ACETAMINOPHEN 500 MG PO TABS: 1000.0000 mg | ORAL_TABLET | Freq: Three times a day (TID) | ORAL | 0 refills | Status: AC

## 2016-09-12 MED ORDER — AMOXICILLIN-POT CLAVULANATE 875-125 MG PO TABS: 1.0000 | ORAL_TABLET | Freq: Two times a day (BID) | ORAL | 0 refills | Status: DC

## 2016-09-12 MED ORDER — CLONIDINE HCL 0.1 MG PO TABS
0.1000 mg | ORAL_TABLET | Freq: Every day | ORAL | Status: DC
  Administered 2016-09-12: 0.1 mg via ORAL
  Filled 2016-09-12: qty 1

## 2016-09-12 MED ORDER — IRBESARTAN 300 MG PO TABS
300.0000 mg | ORAL_TABLET | Freq: Every day | ORAL | Status: DC
  Administered 2016-09-12: 300 mg via ORAL
  Filled 2016-09-12: qty 1

## 2016-09-12 MED ORDER — SACCHAROMYCES BOULARDII 250 MG PO CAPS: ORAL_CAPSULE | ORAL | Status: DC

## 2016-09-12 MED ORDER — AMOXICILLIN-POT CLAVULANATE 875-125 MG PO TABS
1.0000 | ORAL_TABLET | Freq: Two times a day (BID) | ORAL | Status: DC
  Administered 2016-09-12: 1 via ORAL
  Filled 2016-09-12: qty 1

## 2016-09-12 NOTE — Progress Notes (Signed)
1 Day Post-Op  Subjective: He looks good, IV is not working. He is taking full liquids waiting breakfast. Would like to go home. I'm restarting his home blood pressure medicines. Complains of some constipation.  Objective: Vital signs in last 24 hours: Temp:  [97.4 F (36.3 C)-99.1 F (37.3 C)] 97.8 F (36.6 C) (04/19 0458) Pulse Rate:  [71-87] 71 (04/19 0458) Resp:  [13-18] 16 (04/19 0458) BP: (150-181)/(72-91) 150/72 (04/19 0458) SpO2:  [90 %-100 %] 96 % (04/19 0458) Weight:  [88.9 kg (196 lb)-89.3 kg (196 lb 13.9 oz)] 88.9 kg (196 lb) (04/18 1505) Last BM Date: 09/07/16 120 PO 2600 IV 175 urine recorded K+ 3.3 Afebrile, BP up some  Intake/Output from previous day: 04/18 0701 - 04/19 0700 In: 2795.4 [P.O.:120; I.V.:2575.4; IV Piggyback:100] Out: 250 [Urine:175; Blood:75] Intake/Output this shift: No intake/output data recorded.  General appearance: alert, cooperative and no distress Resp: clear to auscultation bilaterally GI: Soft,, sore. Site looks fine. Tolerating full liquids.      Lab Results:   Recent Labs  09/10/16 2058  WBC 17.2*  HGB 14.3  HCT 42.4  PLT 241    BMET  Recent Labs  09/10/16 2058 09/11/16 0912  NA 134* 135  K 3.3* 3.3*  CL 98* 97*  CO2 28 26  GLUCOSE 120* 137*  BUN 31* 26*  CREATININE 1.24 0.96  CALCIUM 9.1 8.7*   PT/INR No results for input(s): LABPROT, INR in the last 72 hours.   Recent Labs Lab 09/10/16 2058 09/11/16 0912  AST 32 55*  ALT 30 47  ALKPHOS 81 117  BILITOT 2.0* 1.4*  PROT 7.7 6.8  ALBUMIN 4.1 3.5     Lipase     Component Value Date/Time   LIPASE 34 09/10/2016 2058     Studies/Results: US Abdomen Limited Ruq  Result Date: 09/10/2016 CLINICAL DATA:  Acute onset of right upper quadrant abdominal pain. Initial encounter. EXAM: US ABDOMEN LIMITED - RIGHT UPPER QUADRANT COMPARISON:  Right upper quadrant ultrasound performed 04/14/2014 FINDINGS: Gallbladder: Stones are lodged at the neck of the  gallbladder, measuring up to 9 mm in size. Diffuse gallbladder wall thickening is noted, with a positive ultrasonographic Murphy's sign, concerning for associated acute cholecystitis. No pericholecystic fluid is appreciated. Common bile duct: Diameter: 0.4 cm, within normal limits in caliber. Liver: No focal lesion identified. Within normal limits in parenchymal echogenicity. IMPRESSION: Diffuse gallbladder wall thickening, with stones lodged at the neck of the gallbladder, measuring up to 9 mm in size. Positive ultrasonographic Murphy's sign limited. Findings are concerning for acute cholecystitis, with possible obstruction at the neck of the gallbladder. Electronically Signed   By: Roanna Raider M.D.   On: 09/10/2016 16:29   Prior to Admission medications   Medication Sig Start Date End Date Taking? Authorizing Provider  aspirin EC 81 MG tablet Take 81 mg by mouth daily.   Yes Historical Provider, MD  cloNIDine (CATAPRES) 0.1 MG tablet Take 0.1 mg by mouth daily.   Yes Historical Provider, MD  ondansetron (ZOFRAN-ODT) 4 MG disintegrating tablet Take 4-8 mg by mouth every 8 (eight) hours as needed for nausea or vomiting.   Yes Historical Provider, MD  simvastatin (ZOCOR) 40 MG tablet Take 40 mg by mouth at bedtime.   Yes Historical Provider, MD  tamsulosin (FLOMAX) 0.4 MG CAPS capsule Take 0.4 mg by mouth at bedtime.   Yes Historical Provider, MD  valsartan (DIOVAN) 320 MG tablet Take 320 mg by mouth daily.   Yes Historical Provider,  MD  methocarbamol (ROBAXIN) 750 MG tablet Take 1 tablet (750 mg total) by mouth 4 (four) times daily as needed (use for muscle cramps/pain). 09/11/16   Karie Soda, MD  oxyCODONE (OXY IR/ROXICODONE) 5 MG immediate release tablet Take 1-2 tablets (5-10 mg total) by mouth every 4 (four) hours as needed for moderate pain, severe pain or breakthrough pain. 09/11/16   Karie Soda, MD     Medications: . acetaminophen  1,000 mg Oral Q8H  . aspirin EC  81 mg Oral Daily  .  cloNIDine  0.1 mg Oral Daily  . enoxaparin (LOVENOX) injection  40 mg Subcutaneous Q24H  . irbesartan  37.5 mg Oral Daily  . lip balm  1 application Topical BID  . psyllium  1 packet Oral Daily  . simvastatin  40 mg Oral QHS  . sodium chloride flush  3 mL Intravenous Q12H  . tamsulosin  0.4 mg Oral QHS   . sodium chloride    . lactated ringers    . piperacillin-tazobactam (ZOSYN)  IV 3.375 g (09/12/16 0744)    Assessment/Plan Acute calculus cholecystitis, Empyema of gallbladder, Fatty change of liver.  s/p LAPAROSCOPIC CHOLECYSTECTOMY SINGLE SITE, 09/11/16, Dr. Karie Soda Hypertension BPH FEN:  IV fluids/full liquids ID:  Zosyn 09/10/16 =>> day 3, not full dose on day 1 or day 2 DVT:  Lovenox   Plan: Advance his diet, mobilize, hitting back on his preadmission blood pressure medicines. Switch him over to Augmentin, Dr. gross wants him to have 5 days worth of antibiotics. If he does well will discharge later today.  LOS: 1 day    JENNINGS,WILLARD 09/12/2016 [phone]

## 2016-09-12 NOTE — Discharge Summary (Signed)
Physician Discharge Summary  Patient ID: Alexander Glover MRN: [id number] DOB/AGE: [birth year] 71 y.o.  Admit date: 09/10/2016 Discharge date: 09/12/2016  Admission Diagnoses:  Cholecystitis, cholelithiasis 2.  HTN 3.  BPH  Discharge Diagnoses:  Same    Principal Problem:   Acute calculous cholecystitis s/p laparoscipic cholecystectomy 09/11/2016 Active Problems:   Hypertension   Fatty liver disease, nonalcoholic   Hypercholesterolemia   BPH (benign prostatic hyperplasia)   PROCEDURES: LAPAROSCOPIC CHOLECYSTECTOMY SINGLE SITE, 09/11/16, Ardeth Sportsman, MD  Hospital Course:  CC:  Abdominal pain Alexander Glover is a 71 y.o. (DOB: [birth year])  white male whose primary care physician is Lolita Patella, MD and comes to the [facility] ER today for abdominal pain.             Alexander Glover, Alexander Glover, is at the bedside.              He has had occasional nausea and abdominal pain with trail mix, but this would last a few hours, then resolve.             On Saturday, 4/14, he had a grilled burger and some strawberry bread and developed a vague abdominal pain.  He did not feel well all night.  He went to [employer] on [address] on Sunday, 4/15.  He was told it was probably Alexander gall bladder acting up and he was to contact Alexander PCP Monday.             On Monday, 4/16, he called Dr. Benjaman Pott office, but he had no openings.  He called Dr. Benjaman Pott office again on Tuesday, 4/17, and was seen by Dr. Azucena Cecil.  Dr. Azucena Cecil obtained an [place] which showed gall stones and suggested that Alexander Glover go to the ER.             He has no fam history of gall bladder disease.               Alexander only prior GI problem is that he had an appendectomy in the 1970's.  He gets a colonoscopy every 3 to 5 years - but cannot remember the name of the doctor who does the colonoscopies.  [place] of abdomen - 09/10/2016 - Diffuse gallbladder wall thickening, with stones lodged at the neck of the gallbladder, measuring up to 9 mm in size.  Positive ultrasonographic Murphy's sign limited. Findings are concerning for acute cholecystitis, with possible obstruction at the neck of the gallbladder.  WBC - 09/10/2016 - 17,200 T. Bili - 09/10/2016 - 2.0  He was seen by Dr. Ezzard Standing and taken to the OR the following day by Dr. Michaell Cowing.  He did well post op and we advanced Alexander diet, resumed Alexander pre admit medicines.  He was ready for discharge later the 1st post op day.  He went home on 5 days of Augmentin.  He had 1st dose this AM.    Condition on d/c:  Improved   CBC Latest Ref Rng & Units 09/10/2016  WBC 4.0 - 10.5 K/uL 17.2(H)  Hemoglobin 13.0 - 17.0 g/dL 40.9  Hematocrit 81.1 - 52.0 % 42.4  Platelets 150 - 400 K/uL 241   CMP Latest Ref Rng & Units 09/11/2016 09/10/2016  Glucose 65 - 99 mg/dL 914(N) 829(F)  BUN 6 - 20 mg/dL 62(Z) 30(Q)  Creatinine 0.61 - 1.24 mg/dL 6.57 8.46  Sodium 962 - 145 mmol/L 135 134(L)  Potassium 3.5 - 5.1 mmol/L 3.3(L) 3.3(L)  Chloride 101 - 111 mmol/L 97(L)  98(L)  CO2 22 - 32 mmol/L 26 28  Calcium 8.9 - 10.3 mg/dL 6.5(H) 9.1  Total Protein 6.5 - 8.1 g/dL 6.8 7.7  Total Bilirubin 0.3 - 1.2 mg/dL 8.4(O) 2.0(H)  Alkaline Phos 38 - 126 U/L 117 81  AST 15 - 41 U/L 55(H) 32  ALT 17 - 63 U/L 47 30    Disposition: 01-Home or Self Care  Discharge Instructions    Call MD for:    Complete by:  As directed    FEVER > 101.5 F  (temperatures < 101.5 F are not significant)   Call MD for:  extreme fatigue    Complete by:  As directed    Call MD for:  persistant dizziness or light-headedness    Complete by:  As directed    Call MD for:  persistant nausea and vomiting    Complete by:  As directed    Call MD for:  redness, tenderness, or signs of infection (pain, swelling, redness, odor or green/yellow discharge around incision site)    Complete by:  As directed    Call MD for:  severe uncontrolled pain    Complete by:  As directed    Diet - low sodium heart healthy    Complete by:  As directed    Follow a  light diet the first few days at home.   Start with a bland diet such as soups, liquids, starchy foods, low fat foods, etc.   If you feel full, bloated, or constipated, stay on a full liquid or pureed/blenderized diet for a few days until you feel better and no longer constipated. Be sure to drink plenty of fluids every day to avoid getting dehydrated (feeling dizzy, not urinating, etc.). Gradually add a fiber supplement to your diet   Discharge instructions    Complete by:  As directed    See Discharge Instructions If you are not getting better after two weeks or are noticing you are getting worse, contact our office [phone] for further advice.  We may need to adjust your medications, re-evaluate you in the office, send you to the emergency room, or see what other things we can do to help. The clinic staff is available to answer your questions during regular business hours (8:30am-5pm).  Please don't hesitate to call and ask to speak to one of our nurses for clinical concerns.    A surgeon from [facility] is always on call at the hospitals 24 hours/day If you have a medical emergency, go to the nearest emergency room or call 911.   Driving Restrictions    Complete by:  As directed    You may drive when you are no longer taking narcotic prescription pain medication, you can comfortably wear a seatbelt, and you can safely make sudden turns/stops to protect yourself without hesitating due to pain.   Increase activity slowly    Complete by:  As directed    Start light daily activities --- self-care, walking, climbing stairs- beginning the day after surgery.  Gradually increase activities as tolerated.  Control your pain to be active.  Stop when you are tired.  Ideally, walk several times a day, eventually an hour a day.   Most people are back to most day-to-day activities in a few weeks.  It takes 4-8 weeks to get back to unrestricted, intense activity. If you can walk 30 minutes  without difficulty, it is safe to try more intense activity such as jogging, treadmill, bicycling,  low-impact aerobics, swimming, etc. Save the most intensive and strenuous activity for last (Usually 4-8 weeks after surgery) such as sit-ups, heavy lifting, contact sports, etc.  Refrain from any intense heavy lifting or straining until you are off narcotics for pain control.  You will have off days, but things should improve week-by-week. DO NOT PUSH THROUGH PAIN.  Let pain be your guide: If it hurts to do something, don't do it.  Pain is your body warning you to avoid that activity for another week until the pain goes down.   Lifting restrictions    Complete by:  As directed    If you can walk 30 minutes without difficulty, it is safe to try more intense activity such as jogging, treadmill, bicycling, low-impact aerobics, swimming, etc. Save the most intensive and strenuous activity for last (Usually 4-8 weeks after surgery) such as sit-ups, heavy lifting, contact sports, etc.  Refrain from any intense heavy lifting or straining until you are off narcotics for pain control.  You will have off days, but things should improve week-by-week. DO NOT PUSH THROUGH PAIN.  Let pain be your guide: If it hurts to do something, don't do it.  Pain is your body warning you to avoid that activity for another week until the pain goes down.   May walk up steps    Complete by:  As directed    No wound care    Complete by:  As directed    It is good for closed incision and even open wounds to be washed every day.  Shower every day.  Short baths are fine.  Wash the incisions and wounds clean with soap & water.    If you have a closed incision(s), wash the incision with soap & water every day.  You may leave closed incisions open to air if it is dry.   You may cover the incision with clean gauze & replace it after your daily shower for comfort. If you have skin tapes (Steristrips) or skin glue (Dermabond) on your incision,  leave them in place.  They will fall off on their own like a scab.  You may trim any edges that curl up with clean scissors.  If you have staples, set up an appointment for them to be removed in the office in 10 days after surgery.  If you have a drain, wash around the skin exit site with soap & water and place a new dressing of gauze or band aid around the skin every day.  Keep the drain site clean & dry.   Sexual Activity Restrictions    Complete by:  As directed    You may have sexual intercourse when it is comfortable. If it hurts to do something, stop.     Allergies as of 09/12/2016   No Known Allergies     Medication List    TAKE these medications   acetaminophen 500 MG tablet Commonly known as:  TYLENOL Take 2 tablets (1,000 mg total) by mouth every 8 (eight) hours.   amoxicillin-clavulanate 875-125 MG tablet Commonly known as:  AUGMENTIN Take 1 tablet by mouth every 12 (twelve) hours.   aspirin EC 81 MG tablet Take 81 mg by mouth daily.   cloNIDine 0.1 MG tablet Commonly known as:  CATAPRES Take 0.1 mg by mouth daily.   methocarbamol 750 MG tablet Commonly known as:  ROBAXIN Take 1 tablet (750 mg total) by mouth 4 (four) times daily as needed (use for muscle cramps/pain).  ondansetron 4 MG disintegrating tablet Commonly known as:  ZOFRAN-ODT Take 4-8 mg by mouth every 8 (eight) hours as needed for nausea or vomiting.   oxyCODONE 5 MG immediate release tablet Commonly known as:  Oxy IR/ROXICODONE Take 1-2 tablets (5-10 mg total) by mouth every 4 (four) hours as needed for moderate pain, severe pain or breakthrough pain.   saccharomyces boulardii 250 MG capsule Commonly known as:  FLORASTOR You can buy this at any drug store.  You can use as directed for the next 2 weeks.   simvastatin 40 MG tablet Commonly known as:  ZOCOR Take 40 mg by mouth at bedtime.   tamsulosin 0.4 MG Caps capsule Commonly known as:  FLOMAX Take 0.4 mg by mouth at bedtime.   valsartan  320 MG tablet Commonly known as:  DIOVAN Take 320 mg by mouth daily.      Follow-up Information    [facility], [place]. Schedule an appointment as soon as possible for a visit on 10/08/2016.   Specialty:  General Surgery Why:   Your appointment is at 9:45 AM, be at the office 30 minutes early for check-in. Bring photo ID and insurance information. Contact information: [address] [place] [address] [phone]          Signed: Sherrie George 09/12/2016, 5:21 PM

## 2016-09-12 NOTE — Progress Notes (Signed)
Pt was discharged home today. Prescriptions were given to the patient, Instructions were reviewed with patient, and questions were answered. Pt was taken to main entrance via wheelchair by NT.

## 2016-10-02 DIAGNOSIS — E782 Mixed hyperlipidemia: Secondary | ICD-10-CM | POA: Diagnosis not present

## 2016-10-02 DIAGNOSIS — N400 Enlarged prostate without lower urinary tract symptoms: Secondary | ICD-10-CM | POA: Diagnosis not present

## 2016-10-02 DIAGNOSIS — I10 Essential (primary) hypertension: Secondary | ICD-10-CM | POA: Diagnosis not present

## 2016-10-02 DIAGNOSIS — Z0000 Encounter for general adult medical examination without abnormal findings: Secondary | ICD-10-CM | POA: Diagnosis not present

## 2016-10-02 DIAGNOSIS — Z125 Encounter for screening for malignant neoplasm of prostate: Secondary | ICD-10-CM | POA: Diagnosis not present

## 2016-10-02 DIAGNOSIS — Z1159 Encounter for screening for other viral diseases: Secondary | ICD-10-CM | POA: Diagnosis not present

## 2016-10-02 DIAGNOSIS — Z1389 Encounter for screening for other disorder: Secondary | ICD-10-CM | POA: Diagnosis not present

## 2016-10-02 DIAGNOSIS — Z6830 Body mass index (BMI) 30.0-30.9, adult: Secondary | ICD-10-CM | POA: Diagnosis not present

## 2016-10-02 DIAGNOSIS — N529 Male erectile dysfunction, unspecified: Secondary | ICD-10-CM | POA: Diagnosis not present

## 2016-10-02 DIAGNOSIS — G4700 Insomnia, unspecified: Secondary | ICD-10-CM | POA: Diagnosis not present

## 2016-10-03 DIAGNOSIS — L821 Other seborrheic keratosis: Secondary | ICD-10-CM | POA: Diagnosis not present

## 2017-01-16 NOTE — Addendum Note (Signed)
Addendum  created 01/16/17 1319 by Joslin, David, MD   Sign clinical note    

## 2017-03-25 DIAGNOSIS — Z23 Encounter for immunization: Secondary | ICD-10-CM | POA: Diagnosis not present

## 2017-06-04 DIAGNOSIS — R69 Illness, unspecified: Secondary | ICD-10-CM | POA: Diagnosis not present

## 2017-09-02 ENCOUNTER — Encounter (HOSPITAL_BASED_OUTPATIENT_CLINIC_OR_DEPARTMENT_OTHER): Payer: Self-pay | Admitting: Emergency Medicine

## 2017-09-02 ENCOUNTER — Emergency Department (HOSPITAL_BASED_OUTPATIENT_CLINIC_OR_DEPARTMENT_OTHER)
Admission: EM | Admit: 2017-09-02 | Discharge: 2017-09-02 | Disposition: A | Discharge status: Home / Self Care | Payer: Medicare HMO | Attending: Emergency Medicine | Admitting: Emergency Medicine

## 2017-09-02 ENCOUNTER — Other Ambulatory Visit: Payer: Self-pay

## 2017-09-02 DIAGNOSIS — I10 Essential (primary) hypertension: Secondary | ICD-10-CM | POA: Insufficient documentation

## 2017-09-02 DIAGNOSIS — Z7982 Long term (current) use of aspirin: Secondary | ICD-10-CM | POA: Diagnosis not present

## 2017-09-02 DIAGNOSIS — E876 Hypokalemia: Secondary | ICD-10-CM | POA: Diagnosis not present

## 2017-09-02 DIAGNOSIS — Z79899 Other long term (current) drug therapy: Secondary | ICD-10-CM | POA: Diagnosis not present

## 2017-09-02 LAB — CBC
HCT: 41.7 % (ref 39.0–52.0)
Hemoglobin: 14.2 g/dL (ref 13.0–17.0)
MCH: 28.7 pg (ref 26.0–34.0)
MCHC: 34.1 g/dL (ref 30.0–36.0)
MCV: 84.2 fL (ref 78.0–100.0)
Platelets: 219 10*3/uL (ref 150–400)
RBC: 4.95 MIL/uL (ref 4.22–5.81)
RDW: 13.4 % (ref 11.5–15.5)
WBC: 9.5 10*3/uL (ref 4.0–10.5)

## 2017-09-02 LAB — BASIC METABOLIC PANEL
Anion gap: 11 (ref 5–15)
BUN: 17 mg/dL (ref 6–20)
CO2: 22 mmol/L (ref 22–32)
Calcium: 9.4 mg/dL (ref 8.9–10.3)
Chloride: 102 mmol/L (ref 101–111)
Creatinine, Ser: 0.85 mg/dL (ref 0.61–1.24)
GFR calc Af Amer: >60 mL/min (ref >60)
GFR calc non Af Amer: >60 mL/min (ref >60)
Glucose, Bld: 128 mg/dL — ABNORMAL HIGH (ref 65–99)
Potassium: 2.8 mmol/L — ABNORMAL LOW (ref 3.5–5.1)
Sodium: 135 mmol/L (ref 135–145)

## 2017-09-02 MED ORDER — CLONIDINE HCL 0.1 MG PO TABS
0.2000 mg | ORAL_TABLET | Freq: Once | ORAL | Status: DC
  Filled 2017-09-02: qty 2

## 2017-09-02 MED ORDER — POTASSIUM CHLORIDE CRYS ER 20 MEQ PO TBCR: 20.0000 meq | EXTENDED_RELEASE_TABLET | Freq: Every day | ORAL | 0 refills | Status: DC

## 2017-09-02 NOTE — ED Provider Notes (Signed)
[facility] EMERGENCY DEPARTMENT Provider Note   CSN: [id number] Arrival date & time: 09/02/17  0057     History   Chief Complaint Chief Complaint  Patient presents with  . Hypertension    HPI Alexander Glover is a 72 y.o. male.  Patient presents to the ER for evaluation of elevated blood pressure.  Patient has a history of hypertension, takes multiple medications.  He admits that he has not always been taking them as prescribed, however.  He has a physical coming up and therefore he has started checking his blood pressure and noticed that it has been running high.  He started to take additional blood pressure medicine, but this is only up to his normal prescribed dose, not extra meds.  Blood pressure was markedly elevated today but he admits to taking it multiple times and becoming anxious about it.  He has not had any headache, blurred vision, chest pain, shortness of breath.     Past Medical History:  Diagnosis Date  . BPH (benign prostatic hyperplasia) 09/11/2016  . Fatty liver disease, nonalcoholic 09/11/2016  . Hypercholesterolemia 09/11/2016  . Hypertension     Patient Active Problem List   Diagnosis Date Noted  . Acute calculous cholecystitis s/p laparoscipic cholecystectomy 09/11/2016 09/11/2016  . Fatty liver disease, nonalcoholic 09/11/2016  . Hypercholesterolemia 09/11/2016  . BPH (benign prostatic hyperplasia) 09/11/2016  . Hypertension     Past Surgical History:  Procedure Laterality Date  . LAPAROSCOPIC CHOLECYSTECTOMY SINGLE PORT N/A 09/11/2016   Procedure: LAPAROSCOPIC CHOLECYSTECTOMY SINGLE SITE;  Surgeon: Karie Soda, MD;  Location: [facility] ORS;  Service: General;  Laterality: N/A;        Home Medications    Prior to Admission medications   Medication Sig Start Date End Date Taking? Authorizing Provider  acetaminophen (TYLENOL) 500 MG tablet Take 2 tablets (1,000 mg total) by mouth every 8 (eight) hours. 09/12/16   Sherrie George, PA-C    aspirin EC 81 MG tablet Take 81 mg by mouth daily.    [provider]  cloNIDine (CATAPRES) 0.1 MG tablet Take 0.1 mg by mouth daily.    [provider]  saccharomyces boulardii (FLORASTOR) 250 MG capsule You can buy this at any drug store.  You can use as directed for the next 2 weeks. 09/12/16   Sherrie George, PA-C  simvastatin (ZOCOR) 40 MG tablet Take 40 mg by mouth at bedtime.    [provider]  tamsulosin (FLOMAX) 0.4 MG CAPS capsule Take 0.4 mg by mouth at bedtime.    [provider]  valsartan (DIOVAN) 320 MG tablet Take 320 mg by mouth daily.    [provider]    Family History No family history on file.  Social History Social History   Tobacco Use  . Smoking status: Never Smoker  . Smokeless tobacco: Never Used  Substance Use Topics  . Alcohol use: Not on file  . Drug use: Not on file     Allergies   Patient has no known allergies.   Review of Systems Review of Systems  Respiratory: Negative for shortness of breath.   Cardiovascular: Negative for chest pain.  All other systems reviewed and are negative.    Physical Exam Updated Vital Signs BP 137/72   Pulse (!) 56   Temp 97.8 F (36.6 C) (Oral)   Resp 18   Ht 5\' 8"  (1.727 m)   Wt 90.7 kg (200 lb)   SpO2 100%   BMI 30.41 kg/m  Physical Exam  Constitutional: He is oriented to person, place, and time. He appears well-developed and well-nourished. No distress.  HENT:  Head: Normocephalic and atraumatic.  Right Ear: Hearing normal.  Left Ear: Hearing normal.  Nose: Nose normal.  Mouth/Throat: Oropharynx is clear and moist and mucous membranes are normal.  Eyes: Pupils are equal, round, and reactive to light. Conjunctivae and EOM are normal.  Neck: Normal range of motion. Neck supple.  Cardiovascular: Regular rhythm, S1 normal and S2 normal. Exam reveals no gallop and no friction rub.  No murmur heard. Pulmonary/Chest: Effort normal and breath sounds  normal. No respiratory distress. He exhibits no tenderness.  Abdominal: Soft. Normal appearance and bowel sounds are normal. There is no hepatosplenomegaly. There is no tenderness. There is no rebound, no guarding, no tenderness at McBurney's point and negative Murphy's sign. No hernia.  Musculoskeletal: Normal range of motion.  Neurological: He is alert and oriented to person, place, and time. He has normal strength. No cranial nerve deficit or sensory deficit. Coordination normal. GCS eye subscore is 4. GCS verbal subscore is 5. GCS motor subscore is 6.  Skin: Skin is warm, dry and intact. No rash noted. No cyanosis.  Psychiatric: He has a normal mood and affect. His speech is normal and behavior is normal. Thought content normal.  Nursing note and vitals reviewed.    ED Treatments / Results  Labs (all labs ordered are listed, but only abnormal results are displayed) Labs Reviewed  CBC  BASIC METABOLIC PANEL    EKG None  Radiology No results found.  Procedures Procedures (including critical care time)  Medications Ordered in ED Medications  cloNIDine (CATAPRES) tablet 0.2 mg (has no administration in time range)     Initial Impression / Assessment and Plan / ED Course  I have reviewed the triage vital signs and the nursing notes.  Pertinent labs & imaging results that were available during my care of the patient were reviewed by me and considered in my medical decision making (see chart for details).     Patient presents with asymptomatic elevated blood pressure.  He has a history of essential hypertension.  He has not been taking his medications as prescribed.  Blood pressure was very elevated at arrival, but he had taken some additional medicine before arrival to the ER.  I was going to medicate him additionally for blood pressure that was persistently elevated for his first hour here, but upon recheck it has come down on its own before further intervention.  Basic labs  unremarkable.  Patient counseled that he needs to get back on his medications as previously prescribed before we consider a need for additional medications at this time.  Final Clinical Impressions(s) / ED Diagnoses   Final diagnoses:  Essential hypertension    ED Discharge Orders    None       Pollina, Canary Brimhristopher J, MD 09/02/17 [phone]

## 2017-09-02 NOTE — ED Triage Notes (Signed)
Pt states blood pressure has been elevated tonight when taking it at home. Pt states he has been compliant with blood pressure medication.

## 2017-09-04 DIAGNOSIS — M542 Cervicalgia: Secondary | ICD-10-CM | POA: Diagnosis not present

## 2017-09-04 DIAGNOSIS — E782 Mixed hyperlipidemia: Secondary | ICD-10-CM | POA: Diagnosis not present

## 2017-09-04 DIAGNOSIS — I10 Essential (primary) hypertension: Secondary | ICD-10-CM | POA: Diagnosis not present

## 2017-10-01 IMAGING — US US ABDOMEN LIMITED
1 series · 14 of 25 positions shown · non-contrast
Comparison: Right upper quadrant ultrasound performed 04/14/2014

CLINICAL DATA: Acute onset of right upper quadrant abdominal pain.
Initial encounter.

EXAM:
US ABDOMEN LIMITED - RIGHT UPPER QUADRANT

[Series 1: us abdomen limited · 0.18mm/px · 14 of 53 slices shown]
[im 1/53]
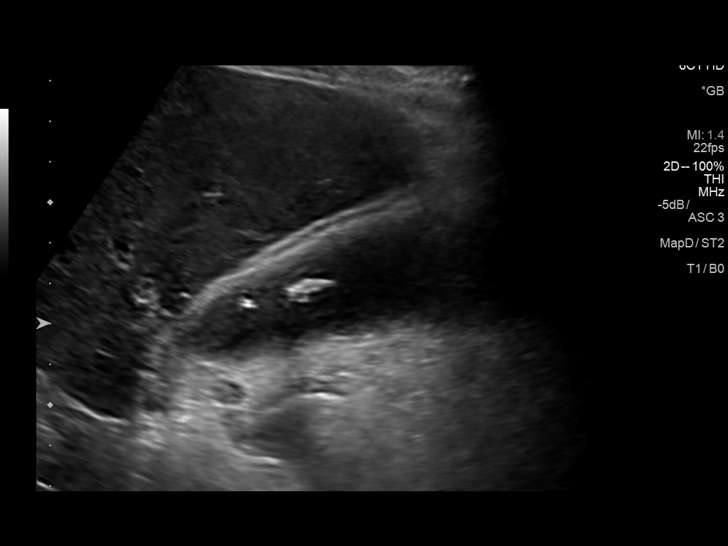
[im 5/53]
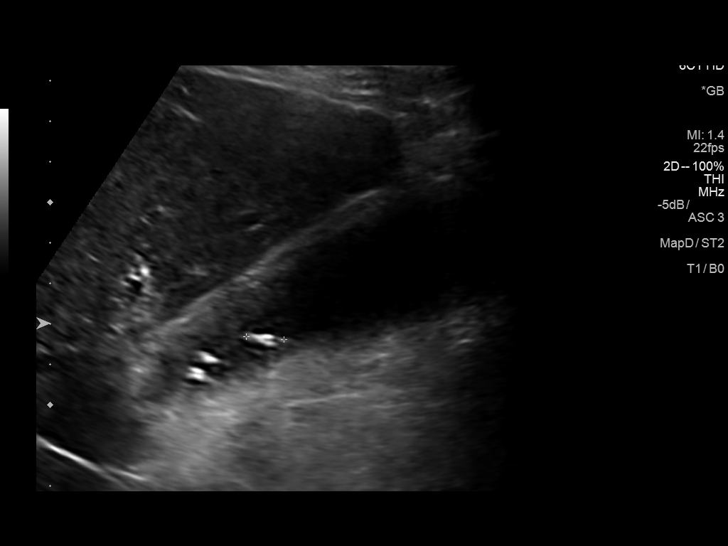
[im 9/53]
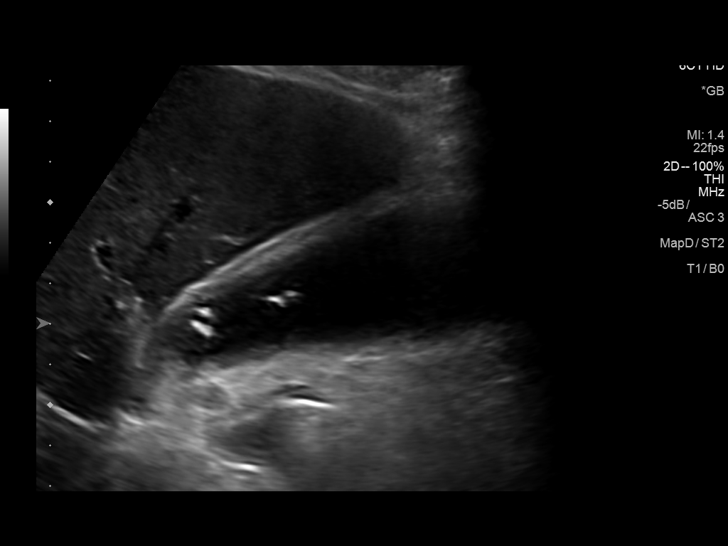
[im 14/53]
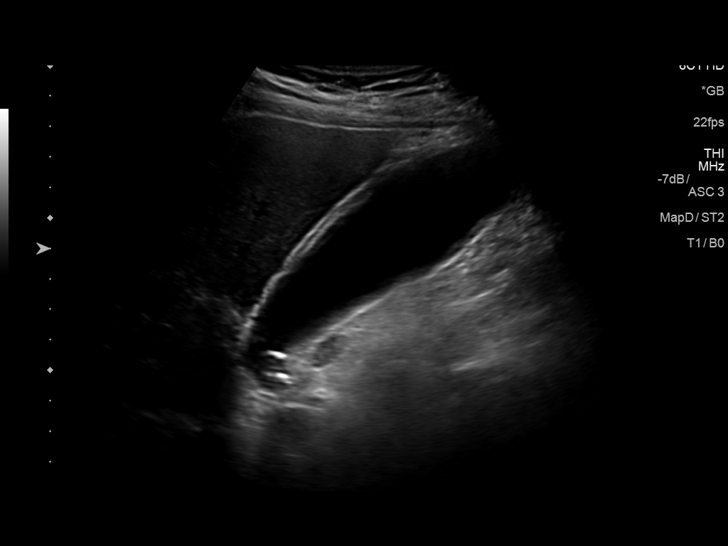
[im 18/53]
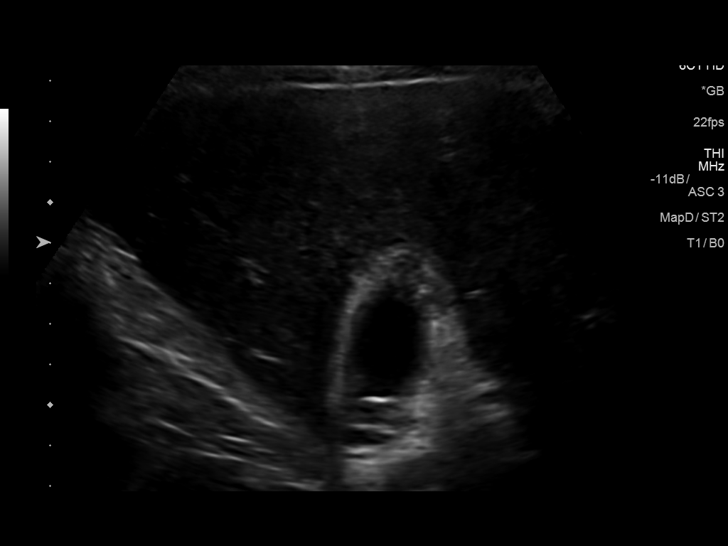
[im 20/53]
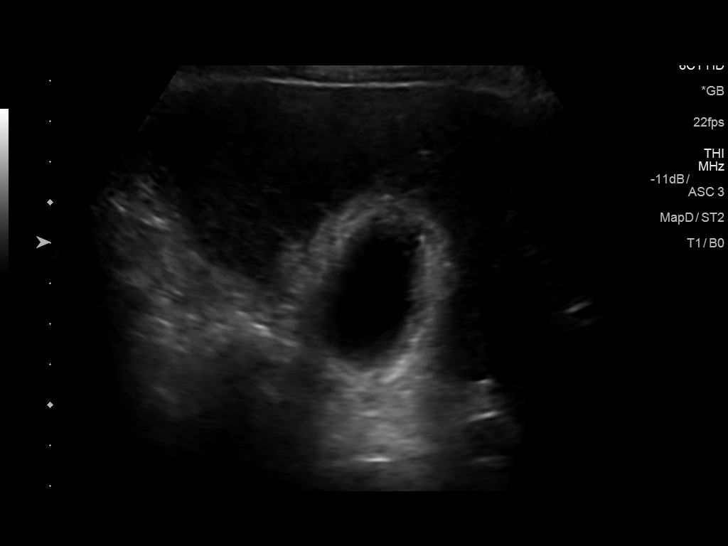
[im 24/53]
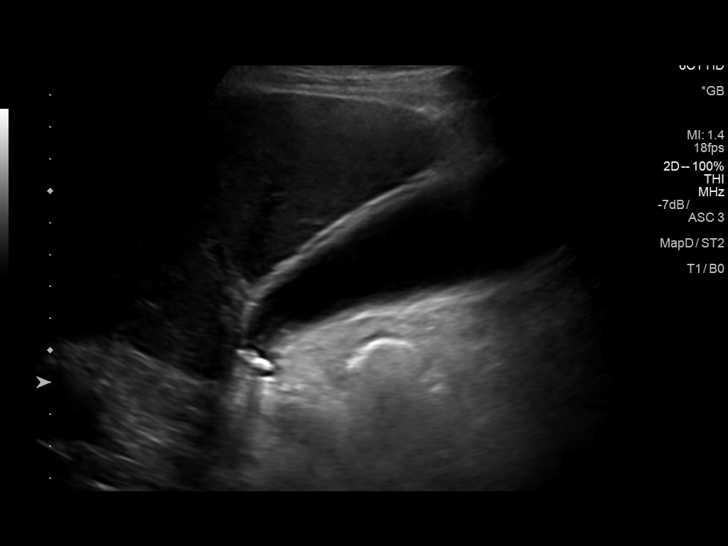
[im 29/53]
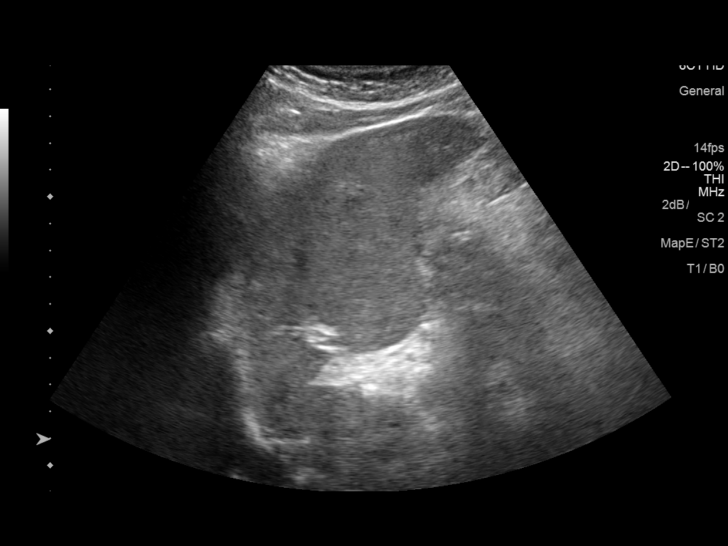
[im 33/53]
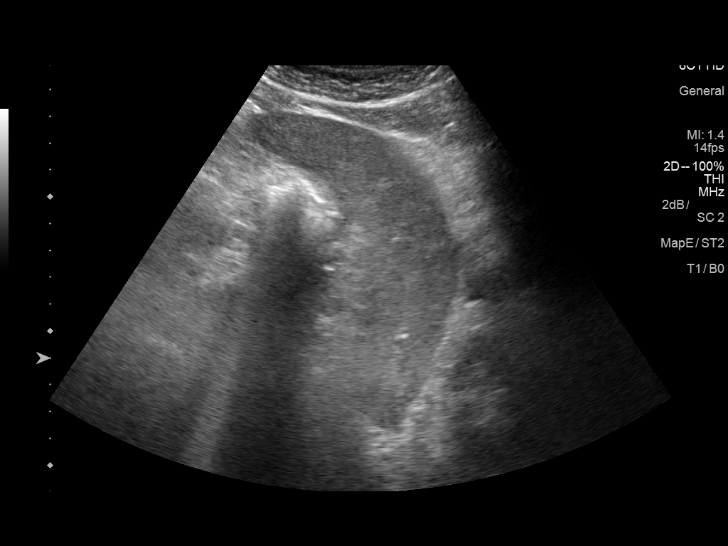
[im 35/53]
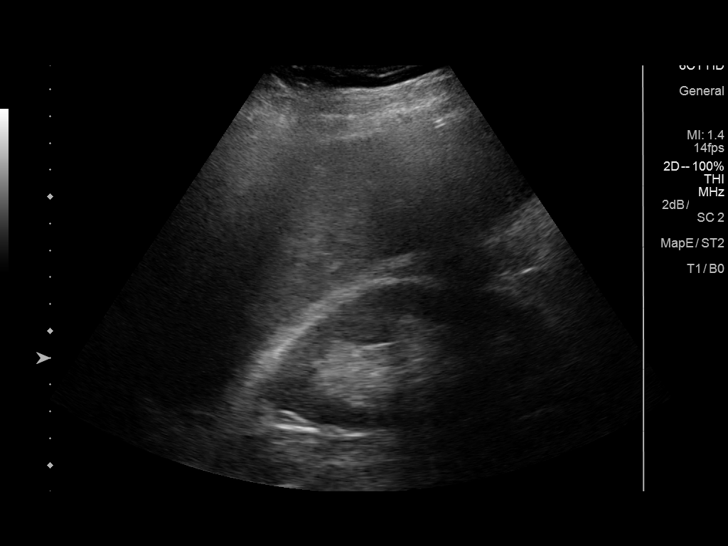
[im 40/53]
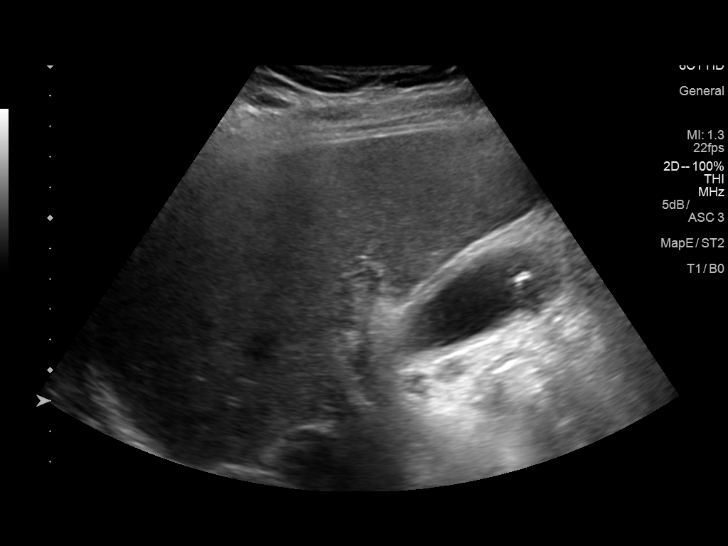
[im 44/53]
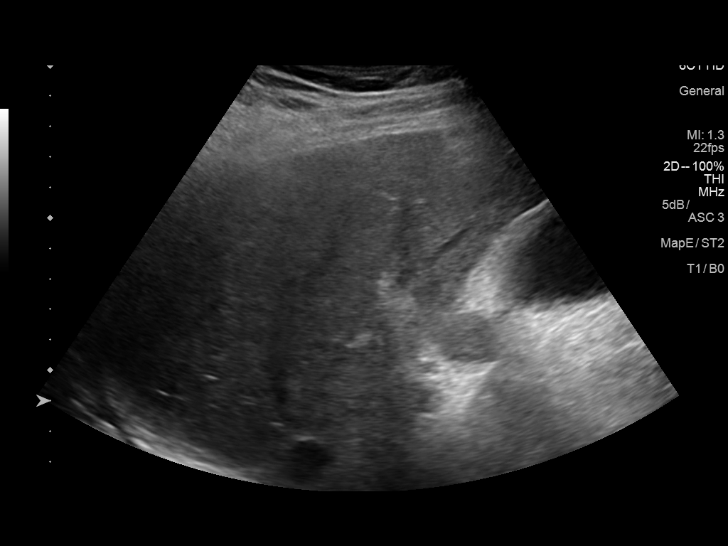
[im 48/53]
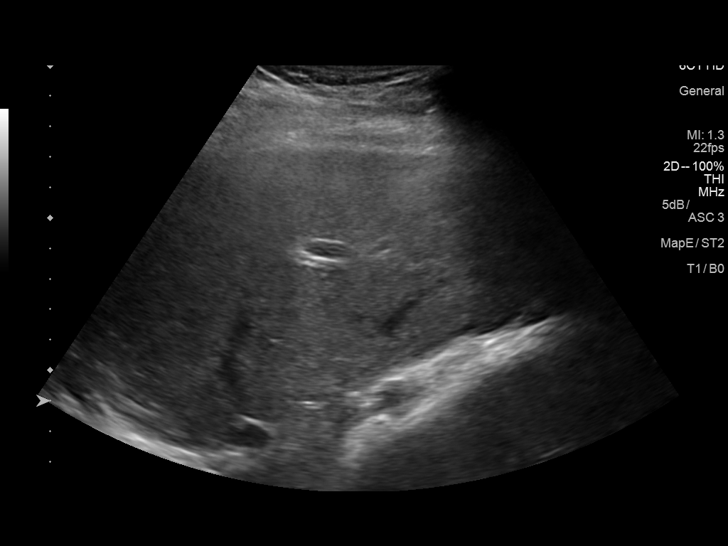
[im 53/53]
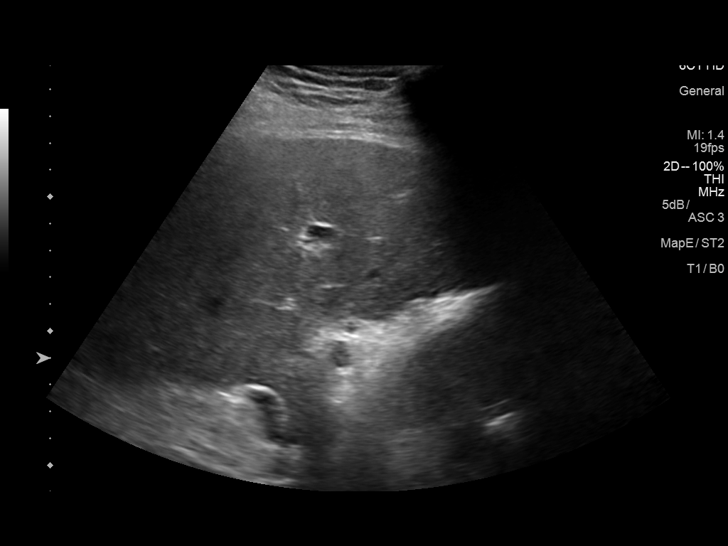

[14 of 25 positions shown; findings below may reference images not displayed]

FINDINGS: Gallbladder:

Stones are lodged at the neck of the gallbladder, measuring up to 9
mm in size. Diffuse gallbladder wall thickening is noted, with a
positive ultrasonographic Murphy's sign, concerning for associated
acute cholecystitis. No pericholecystic fluid is appreciated.

Common bile duct:

Diameter: 0.4 cm, within normal limits in caliber.

Liver:

No focal lesion identified. Within normal limits in parenchymal
echogenicity.
IMPRESSION: Diffuse gallbladder wall thickening, with stones lodged at the neck
of the gallbladder, measuring up to 9 mm in size. Positive
ultrasonographic Murphy's sign limited. Findings are concerning for
acute cholecystitis, with possible obstruction at the neck of the
gallbladder.

## 2017-10-06 DIAGNOSIS — E782 Mixed hyperlipidemia: Secondary | ICD-10-CM | POA: Diagnosis not present

## 2017-10-06 DIAGNOSIS — R739 Hyperglycemia, unspecified: Secondary | ICD-10-CM | POA: Diagnosis not present

## 2017-10-06 DIAGNOSIS — G4700 Insomnia, unspecified: Secondary | ICD-10-CM | POA: Diagnosis not present

## 2017-10-06 DIAGNOSIS — Z1389 Encounter for screening for other disorder: Secondary | ICD-10-CM | POA: Diagnosis not present

## 2017-10-06 DIAGNOSIS — I10 Essential (primary) hypertension: Secondary | ICD-10-CM | POA: Diagnosis not present

## 2017-10-06 DIAGNOSIS — N400 Enlarged prostate without lower urinary tract symptoms: Secondary | ICD-10-CM | POA: Diagnosis not present

## 2017-10-06 DIAGNOSIS — Z0000 Encounter for general adult medical examination without abnormal findings: Secondary | ICD-10-CM | POA: Diagnosis not present

## 2017-10-06 DIAGNOSIS — N529 Male erectile dysfunction, unspecified: Secondary | ICD-10-CM | POA: Diagnosis not present

## 2017-11-06 DIAGNOSIS — H2513 Age-related nuclear cataract, bilateral: Secondary | ICD-10-CM | POA: Diagnosis not present

## 2017-11-06 DIAGNOSIS — H524 Presbyopia: Secondary | ICD-10-CM | POA: Diagnosis not present

## 2017-11-18 DIAGNOSIS — R69 Illness, unspecified: Secondary | ICD-10-CM | POA: Diagnosis not present

## 2018-01-14 DIAGNOSIS — H5203 Hypermetropia, bilateral: Secondary | ICD-10-CM | POA: Diagnosis not present

## 2018-01-14 DIAGNOSIS — H524 Presbyopia: Secondary | ICD-10-CM | POA: Diagnosis not present

## 2018-03-25 DIAGNOSIS — R69 Illness, unspecified: Secondary | ICD-10-CM | POA: Diagnosis not present

## 2018-04-09 DIAGNOSIS — Z23 Encounter for immunization: Secondary | ICD-10-CM | POA: Diagnosis not present

## 2018-05-14 DIAGNOSIS — I10 Essential (primary) hypertension: Secondary | ICD-10-CM | POA: Diagnosis not present

## 2018-05-14 DIAGNOSIS — E785 Hyperlipidemia, unspecified: Secondary | ICD-10-CM | POA: Diagnosis not present

## 2018-05-14 DIAGNOSIS — J019 Acute sinusitis, unspecified: Secondary | ICD-10-CM | POA: Diagnosis not present

## 2018-05-25 DIAGNOSIS — R69 Illness, unspecified: Secondary | ICD-10-CM | POA: Diagnosis not present

## 2018-05-28 DIAGNOSIS — R69 Illness, unspecified: Secondary | ICD-10-CM | POA: Diagnosis not present

## 2018-09-01 DIAGNOSIS — M65341 Trigger finger, right ring finger: Secondary | ICD-10-CM | POA: Diagnosis not present

## 2018-10-22 DIAGNOSIS — I10 Essential (primary) hypertension: Secondary | ICD-10-CM | POA: Diagnosis not present

## 2018-10-22 DIAGNOSIS — E782 Mixed hyperlipidemia: Secondary | ICD-10-CM | POA: Diagnosis not present

## 2018-10-22 DIAGNOSIS — N400 Enlarged prostate without lower urinary tract symptoms: Secondary | ICD-10-CM | POA: Diagnosis not present

## 2018-10-22 DIAGNOSIS — G4700 Insomnia, unspecified: Secondary | ICD-10-CM | POA: Diagnosis not present

## 2018-10-22 DIAGNOSIS — R7303 Prediabetes: Secondary | ICD-10-CM | POA: Diagnosis not present

## 2018-10-22 DIAGNOSIS — Z0000 Encounter for general adult medical examination without abnormal findings: Secondary | ICD-10-CM | POA: Diagnosis not present

## 2018-10-22 DIAGNOSIS — N529 Male erectile dysfunction, unspecified: Secondary | ICD-10-CM | POA: Diagnosis not present

## 2018-10-22 DIAGNOSIS — Z125 Encounter for screening for malignant neoplasm of prostate: Secondary | ICD-10-CM | POA: Diagnosis not present

## 2018-10-22 DIAGNOSIS — Z1211 Encounter for screening for malignant neoplasm of colon: Secondary | ICD-10-CM | POA: Diagnosis not present

## 2018-10-22 DIAGNOSIS — Z1389 Encounter for screening for other disorder: Secondary | ICD-10-CM | POA: Diagnosis not present

## 2018-11-09 DIAGNOSIS — Z125 Encounter for screening for malignant neoplasm of prostate: Secondary | ICD-10-CM | POA: Diagnosis not present

## 2018-11-09 DIAGNOSIS — I10 Essential (primary) hypertension: Secondary | ICD-10-CM | POA: Diagnosis not present

## 2018-11-09 DIAGNOSIS — R7303 Prediabetes: Secondary | ICD-10-CM | POA: Diagnosis not present

## 2018-11-09 DIAGNOSIS — E782 Mixed hyperlipidemia: Secondary | ICD-10-CM | POA: Diagnosis not present

## 2020-04-11 ENCOUNTER — Emergency Department: Admit: 2020-04-11 | Discharge status: Absent without leave | Payer: Self-pay

## 2020-06-07 DIAGNOSIS — L578 Other skin changes due to chronic exposure to nonionizing radiation: Secondary | ICD-10-CM | POA: Diagnosis not present

## 2020-06-07 DIAGNOSIS — L821 Other seborrheic keratosis: Secondary | ICD-10-CM | POA: Diagnosis not present

## 2020-06-22 DIAGNOSIS — I10 Essential (primary) hypertension: Secondary | ICD-10-CM | POA: Diagnosis not present

## 2020-06-22 DIAGNOSIS — G4700 Insomnia, unspecified: Secondary | ICD-10-CM | POA: Diagnosis not present

## 2020-06-22 DIAGNOSIS — E782 Mixed hyperlipidemia: Secondary | ICD-10-CM | POA: Diagnosis not present

## 2020-06-22 DIAGNOSIS — N400 Enlarged prostate without lower urinary tract symptoms: Secondary | ICD-10-CM | POA: Diagnosis not present

## 2020-06-24 DIAGNOSIS — I10 Essential (primary) hypertension: Secondary | ICD-10-CM | POA: Diagnosis not present

## 2020-06-26 DIAGNOSIS — I10 Essential (primary) hypertension: Secondary | ICD-10-CM | POA: Diagnosis not present

## 2020-08-22 DIAGNOSIS — Z7982 Long term (current) use of aspirin: Secondary | ICD-10-CM | POA: Diagnosis not present

## 2020-08-22 DIAGNOSIS — Z87891 Personal history of nicotine dependence: Secondary | ICD-10-CM | POA: Diagnosis not present

## 2020-08-22 DIAGNOSIS — I10 Essential (primary) hypertension: Secondary | ICD-10-CM | POA: Diagnosis not present

## 2020-08-22 DIAGNOSIS — Z85828 Personal history of other malignant neoplasm of skin: Secondary | ICD-10-CM | POA: Diagnosis not present

## 2020-08-22 DIAGNOSIS — Z809 Family history of malignant neoplasm, unspecified: Secondary | ICD-10-CM | POA: Diagnosis not present

## 2020-08-22 DIAGNOSIS — E785 Hyperlipidemia, unspecified: Secondary | ICD-10-CM | POA: Diagnosis not present

## 2020-08-22 DIAGNOSIS — N400 Enlarged prostate without lower urinary tract symptoms: Secondary | ICD-10-CM | POA: Diagnosis not present

## 2020-09-04 ENCOUNTER — Ambulatory Visit (INDEPENDENT_AMBULATORY_CARE_PROVIDER_SITE_OTHER): Payer: Medicare HMO | Admitting: Neurology

## 2020-09-04 ENCOUNTER — Other Ambulatory Visit: Payer: Self-pay

## 2020-09-04 ENCOUNTER — Encounter: Payer: Self-pay | Admitting: Neurology

## 2020-09-04 DIAGNOSIS — G454 Transient global amnesia: Secondary | ICD-10-CM | POA: Insufficient documentation

## 2020-09-04 HISTORY — DX: Transient global amnesia: G45.4

## 2020-09-04 NOTE — Progress Notes (Signed)
Reason for visit: Transient global amnesia  Referring physician: Dr. Oran Rein Coye Alexander Glover is a 75 y.o. male  History of present illness:  Mr. Scheidt is a 75 year old right-handed white male with a history of prediabetes with a hemoglobin A1c of 6.2.  The patient had an event on 11 April 2020 and was admitted to the hospital for full work-up.  The patient recalls getting up that morning, he took his dog on his usual 2 mile walk, but he has no recollection of this.  He does not recall eating breakfast or taking his medications in the morning.  He was to have doctor's appointment at 1:00 PM, he was to be ready for the appointment when his wife returned from shopping.  The wife came home around 12:15 PM and the patient was still in his underwear.  The patient was repeating himself, he appeared to have a normal sensorium otherwise, no facial droop or slurred speech or gait instability was noted.  The patient was asking questions again and again.  The patient has no recollection of the doctor's visit, they return home at 3 PM and he was still not acting normally and the wife decided to take him to the hospital.  The patient started remembering things around the time he went to the hospital, he recalls having a bad headache and some elevation of his blood pressure.  He had no nausea or vomiting.  He reported no focal weakness or numbness of the extremities or face.  He has not had any events similar to this previously or since.  The entire episode of amnesia lasted about 7 hours.  Past Medical History:  Diagnosis Date  . BPH (benign prostatic hyperplasia) 09/11/2016  . Fatty liver disease, nonalcoholic 09/11/2016  . Hypercholesterolemia 09/11/2016  . Hypertension     Past Surgical History:  Procedure Laterality Date  . LAPAROSCOPIC CHOLECYSTECTOMY SINGLE PORT N/A 09/11/2016   Procedure: LAPAROSCOPIC CHOLECYSTECTOMY SINGLE SITE;  Surgeon: Karie Soda, MD;  Location: [facility];  Service: General;   Laterality: N/A;    History reviewed. No pertinent family history.  Social history:  reports that he has never smoked. He has never used smokeless tobacco. He reports that he does not drink alcohol. No history on file for drug use.  Medications:  Prior to Admission medications   Medication Sig Start Date End Date Taking? Authorizing Provider  atenolol-chlorthalidone (TENORETIC) 50-25 MG tablet Take 0.5 tablets by mouth daily.   Yes [provider]  Cranberry 425 MG CAPS Take by mouth.   Yes [provider]  Garlic 500 MG CAPS Take by mouth.   Yes [provider]  Ginkgo Biloba (GINKOBA PO) Take by mouth.   Yes [provider]  irbesartan (AVAPRO) 300 MG tablet Take 300 mg by mouth daily.   Yes [provider]  Multiple Vitamin (MULTIVITAMIN) tablet Take 1 tablet by mouth daily.   Yes [provider]  Multiple Vitamins-Minerals (CORAL CALCIUM PLUS PO) Take by mouth.   Yes [provider]  Omega-3 Fatty Acids (FISH OIL) 1000 MG CAPS Take 1,500 mg by mouth daily.   Yes [provider]  sildenafil (REVATIO) 20 MG tablet Take 20 mg by mouth 3 (three) times daily.   Yes [provider]  vitamin C (ASCORBIC ACID) 500 MG tablet Take 500 mg by mouth daily.   Yes [provider]  zinc gluconate 50 MG tablet Take 50 mg by mouth daily.   Yes [provider]  acetaminophen (TYLENOL) 500 MG tablet Take 2 tablets (1,000 mg total) by mouth every 8 (eight) hours. 09/12/16   Sherrie George, PA-C  aspirin EC 81 MG tablet Take 81 mg by mouth daily.    [provider]  cloNIDine (CATAPRES) 0.1 MG tablet Take 0.1 mg by mouth daily.    [provider]  potassium chloride SA (K-DUR,KLOR-CON) 20 MEQ tablet Take 1 tablet (20 mEq total) by mouth daily. 09/02/17   Gilda Crease, MD  saccharomyces boulardii (FLORASTOR) 250 MG capsule You can buy this at any drug store.  You can use as directed  for the next 2 weeks. 09/12/16   Sherrie George, PA-C  simvastatin (ZOCOR) 40 MG tablet Take 40 mg by mouth at bedtime.    [provider]  tamsulosin (FLOMAX) 0.4 MG CAPS capsule Take 0.4 mg by mouth at bedtime.    [provider]  valsartan (DIOVAN) 320 MG tablet Take 320 mg by mouth daily.    [provider]     No Known Allergies  ROS:  Out of a complete 14 system review of symptoms, the patient complains only of the following symptoms, and all other reviewed systems are negative.  Amnesia Headache  Height 5\' 8"  (1.727 m), weight 202 lb 3.2 oz (91.7 kg).  Physical Exam  General: The patient is alert and cooperative at the time of the examination.  The patient is mildly obese.  Eyes: Pupils are equal, round, and reactive to light. Discs are flat bilaterally.  Neck: The neck is supple, no carotid bruits are noted.  Respiratory: The respiratory examination is clear.  Cardiovascular: The cardiovascular examination reveals a regular rate and rhythm, no obvious murmurs or rubs are noted.  Skin: Extremities are without significant edema.  Neurologic Exam  Mental status: The patient is alert and oriented x 3 at the time of the examination. The patient has apparent normal recent and remote memory, with an apparently normal attention span and concentration ability.  Cranial nerves: Facial symmetry is present. There is good sensation of the face to pinprick and soft touch bilaterally. The strength of the facial muscles and the muscles to head turning and shoulder shrug are normal bilaterally. Speech is well enunciated, no aphasia or dysarthria is noted. Extraocular movements are full. Visual fields are full. The tongue is midline, and the patient has symmetric elevation of the soft palate. No obvious hearing deficits are noted.  Motor: The motor testing reveals 5 over 5 strength of all 4 extremities. Good symmetric motor tone is noted throughout.  Sensory:  Sensory testing is intact to pinprick, soft touch, vibration sensation, and position sense on all 4 extremities. No evidence of extinction is noted.  Coordination: Cerebellar testing reveals good finger-nose-finger and heel-to-shin bilaterally.  Gait and station: Gait is normal. Tandem gait is normal. Romberg is negative. No drift is seen.  Reflexes: Deep tendon reflexes are symmetric and normal bilaterally. Toes are downgoing bilaterally.   EEG 04/12/20:  Interpretation: This is normal awake and drowsy EEG.     MRI brain 04/11/20:  IMPRESSION:  1. No acute intracranial infarction or hemorrhage.  2. No contrast-enhancing lesions or vascular malformations are identified.   CTA neck 04/11/20:    IMPRESSION:   1. No acute arterial abnormality within the head or neck. No large vessel occlusion or significant stenosis.   Degree of stenosis is determined using NASCET measurement technique:  Severe: 70-99%  Moderate: 50-69%.  Mild: Less than 50%.  2D echo 04/12/20:  Impression  LeftAtrium: Injection of agitated saline documents no interatrial  shunt..  . MitralValve: There is mild regurgitation.  . LeftVentricle: Systolic function is normal. EF: 60-65%.  Assessment/Plan:  1.  Transient global amnesia  The patient had a fairly typical event of transient global amnesia lasting about 7 hours.  The patient is on low-dose aspirin, he is to remain on this.  The patient had a full stroke evaluation that was unremarkable.  An EEG study was normal.  No further work-up is indicated.  He will follow up here on an as-needed basis.  Marlan Palau MD 09/04/2020 11:27 AM  [facility] [address] [place], [place] [address]  Phone [phone] Fax [phone]

## 2020-09-22 DIAGNOSIS — I10 Essential (primary) hypertension: Secondary | ICD-10-CM | POA: Diagnosis not present

## 2020-10-19 DIAGNOSIS — M25561 Pain in right knee: Secondary | ICD-10-CM | POA: Diagnosis not present

## 2020-11-01 DIAGNOSIS — Z1389 Encounter for screening for other disorder: Secondary | ICD-10-CM | POA: Diagnosis not present

## 2020-11-01 DIAGNOSIS — I10 Essential (primary) hypertension: Secondary | ICD-10-CM | POA: Diagnosis not present

## 2020-11-01 DIAGNOSIS — R7309 Other abnormal glucose: Secondary | ICD-10-CM | POA: Diagnosis not present

## 2020-11-01 DIAGNOSIS — N400 Enlarged prostate without lower urinary tract symptoms: Secondary | ICD-10-CM | POA: Diagnosis not present

## 2020-11-01 DIAGNOSIS — Z1211 Encounter for screening for malignant neoplasm of colon: Secondary | ICD-10-CM | POA: Diagnosis not present

## 2020-11-01 DIAGNOSIS — Z0000 Encounter for general adult medical examination without abnormal findings: Secondary | ICD-10-CM | POA: Diagnosis not present

## 2020-11-01 DIAGNOSIS — N529 Male erectile dysfunction, unspecified: Secondary | ICD-10-CM | POA: Diagnosis not present

## 2020-11-01 DIAGNOSIS — E782 Mixed hyperlipidemia: Secondary | ICD-10-CM | POA: Diagnosis not present

## 2020-11-01 DIAGNOSIS — G4700 Insomnia, unspecified: Secondary | ICD-10-CM | POA: Diagnosis not present

## 2020-11-01 DIAGNOSIS — R7303 Prediabetes: Secondary | ICD-10-CM | POA: Diagnosis not present

## 2020-11-01 DIAGNOSIS — G454 Transient global amnesia: Secondary | ICD-10-CM | POA: Diagnosis not present

## 2020-11-13 DIAGNOSIS — M1711 Unilateral primary osteoarthritis, right knee: Secondary | ICD-10-CM | POA: Diagnosis not present

## 2021-01-17 DIAGNOSIS — G4700 Insomnia, unspecified: Secondary | ICD-10-CM | POA: Diagnosis not present

## 2021-01-17 DIAGNOSIS — E782 Mixed hyperlipidemia: Secondary | ICD-10-CM | POA: Diagnosis not present

## 2021-01-17 DIAGNOSIS — I10 Essential (primary) hypertension: Secondary | ICD-10-CM | POA: Diagnosis not present

## 2021-01-17 DIAGNOSIS — N400 Enlarged prostate without lower urinary tract symptoms: Secondary | ICD-10-CM | POA: Diagnosis not present

## 2021-01-23 DIAGNOSIS — K048 Radicular cyst: Secondary | ICD-10-CM | POA: Diagnosis not present

## 2021-02-08 DIAGNOSIS — H25813 Combined forms of age-related cataract, bilateral: Secondary | ICD-10-CM | POA: Diagnosis not present

## 2021-02-08 DIAGNOSIS — Z0100 Encounter for examination of eyes and vision without abnormal findings: Secondary | ICD-10-CM | POA: Diagnosis not present

## 2021-02-08 DIAGNOSIS — H527 Unspecified disorder of refraction: Secondary | ICD-10-CM | POA: Diagnosis not present

## 2021-04-04 DIAGNOSIS — M170 Bilateral primary osteoarthritis of knee: Secondary | ICD-10-CM | POA: Diagnosis not present

## 2021-04-04 DIAGNOSIS — M1711 Unilateral primary osteoarthritis, right knee: Secondary | ICD-10-CM | POA: Diagnosis not present

## 2021-05-03 DIAGNOSIS — Z1211 Encounter for screening for malignant neoplasm of colon: Secondary | ICD-10-CM | POA: Diagnosis not present

## 2021-05-03 DIAGNOSIS — N400 Enlarged prostate without lower urinary tract symptoms: Secondary | ICD-10-CM | POA: Diagnosis not present

## 2021-05-03 DIAGNOSIS — Z23 Encounter for immunization: Secondary | ICD-10-CM | POA: Diagnosis not present

## 2021-05-03 DIAGNOSIS — G4700 Insomnia, unspecified: Secondary | ICD-10-CM | POA: Diagnosis not present

## 2021-05-03 DIAGNOSIS — G454 Transient global amnesia: Secondary | ICD-10-CM | POA: Diagnosis not present

## 2021-05-03 DIAGNOSIS — N529 Male erectile dysfunction, unspecified: Secondary | ICD-10-CM | POA: Diagnosis not present

## 2021-05-03 DIAGNOSIS — Z125 Encounter for screening for malignant neoplasm of prostate: Secondary | ICD-10-CM | POA: Diagnosis not present

## 2021-05-03 DIAGNOSIS — E782 Mixed hyperlipidemia: Secondary | ICD-10-CM | POA: Diagnosis not present

## 2021-05-03 DIAGNOSIS — I10 Essential (primary) hypertension: Secondary | ICD-10-CM | POA: Diagnosis not present

## 2021-05-03 DIAGNOSIS — R7303 Prediabetes: Secondary | ICD-10-CM | POA: Diagnosis not present

## 2021-05-29 ENCOUNTER — Other Ambulatory Visit: Payer: Self-pay | Admitting: Family Medicine

## 2021-05-29 DIAGNOSIS — R1319 Other dysphagia: Secondary | ICD-10-CM

## 2021-06-01 ENCOUNTER — Ambulatory Visit
Admission: RE | Admit: 2021-06-01 | Discharge: 2021-06-01 | Disposition: A | Discharge status: Home / Self Care | Payer: Medicare HMO | Source: Ambulatory Visit | Attending: Family Medicine | Admitting: Family Medicine

## 2021-06-01 DIAGNOSIS — K219 Gastro-esophageal reflux disease without esophagitis: Secondary | ICD-10-CM | POA: Diagnosis not present

## 2021-06-01 DIAGNOSIS — R1310 Dysphagia, unspecified: Secondary | ICD-10-CM | POA: Diagnosis not present

## 2021-06-01 DIAGNOSIS — R1319 Other dysphagia: Secondary | ICD-10-CM

## 2021-06-26 DIAGNOSIS — N529 Male erectile dysfunction, unspecified: Secondary | ICD-10-CM | POA: Diagnosis not present

## 2021-06-26 DIAGNOSIS — K219 Gastro-esophageal reflux disease without esophagitis: Secondary | ICD-10-CM | POA: Diagnosis not present

## 2021-06-26 DIAGNOSIS — Z7722 Contact with and (suspected) exposure to environmental tobacco smoke (acute) (chronic): Secondary | ICD-10-CM | POA: Diagnosis not present

## 2021-06-26 DIAGNOSIS — Z87891 Personal history of nicotine dependence: Secondary | ICD-10-CM | POA: Diagnosis not present

## 2021-06-26 DIAGNOSIS — E785 Hyperlipidemia, unspecified: Secondary | ICD-10-CM | POA: Diagnosis not present

## 2021-06-26 DIAGNOSIS — E669 Obesity, unspecified: Secondary | ICD-10-CM | POA: Diagnosis not present

## 2021-06-26 DIAGNOSIS — N400 Enlarged prostate without lower urinary tract symptoms: Secondary | ICD-10-CM | POA: Diagnosis not present

## 2021-06-26 DIAGNOSIS — Z7982 Long term (current) use of aspirin: Secondary | ICD-10-CM | POA: Diagnosis not present

## 2021-06-26 DIAGNOSIS — Z008 Encounter for other general examination: Secondary | ICD-10-CM | POA: Diagnosis not present

## 2021-06-26 DIAGNOSIS — M1990 Unspecified osteoarthritis, unspecified site: Secondary | ICD-10-CM | POA: Diagnosis not present

## 2021-06-26 DIAGNOSIS — Z8249 Family history of ischemic heart disease and other diseases of the circulatory system: Secondary | ICD-10-CM | POA: Diagnosis not present

## 2021-06-26 DIAGNOSIS — Z6830 Body mass index (BMI) 30.0-30.9, adult: Secondary | ICD-10-CM | POA: Diagnosis not present

## 2021-06-26 DIAGNOSIS — I10 Essential (primary) hypertension: Secondary | ICD-10-CM | POA: Diagnosis not present

## 2021-07-16 DIAGNOSIS — J069 Acute upper respiratory infection, unspecified: Secondary | ICD-10-CM | POA: Diagnosis not present

## 2021-07-27 DIAGNOSIS — H04123 Dry eye syndrome of bilateral lacrimal glands: Secondary | ICD-10-CM | POA: Diagnosis not present

## 2021-07-27 DIAGNOSIS — H43822 Vitreomacular adhesion, left eye: Secondary | ICD-10-CM | POA: Diagnosis not present

## 2021-08-30 DIAGNOSIS — M1711 Unilateral primary osteoarthritis, right knee: Secondary | ICD-10-CM | POA: Diagnosis not present

## 2021-11-21 DIAGNOSIS — L578 Other skin changes due to chronic exposure to nonionizing radiation: Secondary | ICD-10-CM | POA: Diagnosis not present

## 2021-11-21 DIAGNOSIS — L570 Actinic keratosis: Secondary | ICD-10-CM | POA: Diagnosis not present

## 2021-11-21 DIAGNOSIS — L821 Other seborrheic keratosis: Secondary | ICD-10-CM | POA: Diagnosis not present

## 2021-11-26 DIAGNOSIS — G454 Transient global amnesia: Secondary | ICD-10-CM | POA: Diagnosis not present

## 2021-11-26 DIAGNOSIS — Z125 Encounter for screening for malignant neoplasm of prostate: Secondary | ICD-10-CM | POA: Diagnosis not present

## 2021-11-26 DIAGNOSIS — I10 Essential (primary) hypertension: Secondary | ICD-10-CM | POA: Diagnosis not present

## 2021-11-26 DIAGNOSIS — G4700 Insomnia, unspecified: Secondary | ICD-10-CM | POA: Diagnosis not present

## 2021-11-26 DIAGNOSIS — N529 Male erectile dysfunction, unspecified: Secondary | ICD-10-CM | POA: Diagnosis not present

## 2021-11-26 DIAGNOSIS — R7303 Prediabetes: Secondary | ICD-10-CM | POA: Diagnosis not present

## 2021-11-26 DIAGNOSIS — Z1211 Encounter for screening for malignant neoplasm of colon: Secondary | ICD-10-CM | POA: Diagnosis not present

## 2021-11-26 DIAGNOSIS — Z0000 Encounter for general adult medical examination without abnormal findings: Secondary | ICD-10-CM | POA: Diagnosis not present

## 2021-11-26 DIAGNOSIS — E782 Mixed hyperlipidemia: Secondary | ICD-10-CM | POA: Diagnosis not present

## 2021-11-26 DIAGNOSIS — K219 Gastro-esophageal reflux disease without esophagitis: Secondary | ICD-10-CM | POA: Diagnosis not present

## 2021-11-26 DIAGNOSIS — N400 Enlarged prostate without lower urinary tract symptoms: Secondary | ICD-10-CM | POA: Diagnosis not present

## 2021-11-26 DIAGNOSIS — Z1331 Encounter for screening for depression: Secondary | ICD-10-CM | POA: Diagnosis not present

## 2022-01-22 DIAGNOSIS — Z86010 Personal history of colonic polyps: Secondary | ICD-10-CM | POA: Diagnosis not present

## 2022-01-22 DIAGNOSIS — K5730 Diverticulosis of large intestine without perforation or abscess without bleeding: Secondary | ICD-10-CM | POA: Diagnosis not present

## 2022-01-22 DIAGNOSIS — K649 Unspecified hemorrhoids: Secondary | ICD-10-CM | POA: Diagnosis not present

## 2022-01-22 DIAGNOSIS — Z09 Encounter for follow-up examination after completed treatment for conditions other than malignant neoplasm: Secondary | ICD-10-CM | POA: Diagnosis not present

## 2022-01-22 LAB — HM COLONOSCOPY

## 2022-02-13 DIAGNOSIS — R252 Cramp and spasm: Secondary | ICD-10-CM | POA: Diagnosis not present

## 2022-02-27 DIAGNOSIS — Z23 Encounter for immunization: Secondary | ICD-10-CM | POA: Diagnosis not present

## 2022-02-27 DIAGNOSIS — M791 Myalgia, unspecified site: Secondary | ICD-10-CM | POA: Diagnosis not present

## 2022-03-05 DIAGNOSIS — M545 Low back pain, unspecified: Secondary | ICD-10-CM | POA: Diagnosis not present

## 2022-03-14 ENCOUNTER — Ambulatory Visit (HOSPITAL_COMMUNITY)
Admission: RE | Admit: 2022-03-14 | Discharge: 2022-03-14 | Disposition: A | Discharge status: Home / Self Care | Payer: Medicare HMO | Source: Ambulatory Visit | Attending: Vascular Surgery | Admitting: Vascular Surgery

## 2022-03-14 ENCOUNTER — Other Ambulatory Visit (HOSPITAL_COMMUNITY): Payer: Self-pay | Admitting: Family Medicine

## 2022-03-14 DIAGNOSIS — M79661 Pain in right lower leg: Secondary | ICD-10-CM

## 2022-03-14 DIAGNOSIS — M79662 Pain in left lower leg: Secondary | ICD-10-CM | POA: Insufficient documentation

## 2022-03-21 DIAGNOSIS — M79605 Pain in left leg: Secondary | ICD-10-CM | POA: Diagnosis not present

## 2022-03-21 DIAGNOSIS — M79604 Pain in right leg: Secondary | ICD-10-CM | POA: Diagnosis not present

## 2022-04-05 ENCOUNTER — Ambulatory Visit (INDEPENDENT_AMBULATORY_CARE_PROVIDER_SITE_OTHER): Payer: Medicare HMO | Admitting: Sports Medicine

## 2022-04-05 DIAGNOSIS — M48062 Spinal stenosis, lumbar region with neurogenic claudication: Secondary | ICD-10-CM

## 2022-04-05 MED ORDER — GABAPENTIN 300 MG PO CAPS: ORAL_CAPSULE | ORAL | 3 refills | Status: DC

## 2022-04-05 MED ORDER — TRIAZOLAM 0.25 MG PO TABS: ORAL_TABLET | ORAL | 0 refills | Status: DC

## 2022-04-05 NOTE — Assessment & Plan Note (Signed)
This is a very pleasant 76 year old male, he has a long history of pain in the hamstrings, posterior knees and calfs, worse with walking. He notices it is worse with the initial walk and then improves as he continues to walk. He has been to several other providers, he does have knee osteoarthritis and has had some injections. He was also given a muscle relaxer and some pain medication, did not improve significantly. He did have lumbar spine x-rays recently that showed mild degenerative changes. He is here for further evaluation. His exam is for the most part unrevealing with a good dorsalis pedis and posterior tibial pulses. Negative Homans' sign bilaterally, mild effusion right knee but tenderness is not concordant. He has a negative straight leg raise bilaterally but does have reproduction of cramping in his calves and posterior thighs with standing spinal extension. 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 Adding gabapentin in the slow up taper, considering failure of conservative treatment we will also add an MRI of the lumbar spine for confirmation. I would like him to do some additional home physical therapy, and if insufficient improvement after 4 to 6 weeks we will consider a lumbar epidurals.

## 2022-04-05 NOTE — Addendum Note (Signed)
Addended by: Monica Becton on: 04/05/2022 03:07 PM   Modules accepted: Orders

## 2022-04-05 NOTE — Progress Notes (Signed)
    Procedures performed today:    None.  Independent interpretation of notes and tests performed by another provider:   None.  Brief History, Exam, Impression, and Recommendations:    Lumbar stenosis with neurogenic claudication This is a very pleasant 76 year old male, he has a long history of pain in the hamstrings, posterior knees and calfs, worse with walking. He notices it is worse with the initial walk and then improves as he continues to walk. He has been to several other providers, he does have knee osteoarthritis and has had some injections. He was also given a muscle relaxer and some pain medication, did not improve significantly. He did have lumbar spine x-rays recently that showed mild degenerative changes. He is here for further evaluation. His exam is for the most part unrevealing with a good dorsalis pedis and posterior tibial pulses. Negative Homans' sign bilaterally, mild effusion right knee but tenderness is not concordant. He has a negative straight leg raise bilaterally but does have reproduction of cramping in his calves and posterior thighs with standing spinal extension. 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 Adding gabapentin in the slow up taper, considering failure of conservative treatment we will also add an MRI of the lumbar spine for confirmation. I would like him to do some additional home physical therapy, and if insufficient improvement after 4 to 6 weeks we will consider a lumbar epidurals.  Chronic process with exacerbation and pharmacologic intervention  ____________________________________________ Ihor Austin. Benjamin Stain, M.D., ABFM., CAQSM., AME. Primary Care and Sports Medicine [place] [facility]  Adjunct Professor of Family Medicine  [place] [employer] [facility] of Medicine  Restaurant manager, fast food

## 2022-04-13 ENCOUNTER — Ambulatory Visit (INDEPENDENT_AMBULATORY_CARE_PROVIDER_SITE_OTHER): Payer: Medicare HMO

## 2022-04-13 DIAGNOSIS — M48061 Spinal stenosis, lumbar region without neurogenic claudication: Secondary | ICD-10-CM | POA: Diagnosis not present

## 2022-04-13 DIAGNOSIS — M48062 Spinal stenosis, lumbar region with neurogenic claudication: Secondary | ICD-10-CM

## 2022-04-13 DIAGNOSIS — M5126 Other intervertebral disc displacement, lumbar region: Secondary | ICD-10-CM | POA: Diagnosis not present

## 2022-04-24 ENCOUNTER — Ambulatory Visit (INDEPENDENT_AMBULATORY_CARE_PROVIDER_SITE_OTHER): Payer: Medicare HMO | Admitting: Sports Medicine

## 2022-04-24 VITALS — BP 157/88 | HR 89 | Wt 202.0 lb

## 2022-04-24 DIAGNOSIS — M48062 Spinal stenosis, lumbar region with neurogenic claudication: Secondary | ICD-10-CM

## 2022-04-24 NOTE — Progress Notes (Signed)
    Procedures performed today:    None.  Independent interpretation of notes and tests performed by another provider:   MRI personally reviewed, there is mild to moderate central canal stenosis at L4-L5, multifactorial from ligamentum flavum hypertrophy, facet hypertrophy and disc protrusion.  Brief History, Exam, Impression, and Recommendations:    Lumbar stenosis with neurogenic claudication This is a very pleasant 76 year old male, long history of axial back pain radiating into the hamstrings, posterior knees and calfs, worse with walking. Reproduction of pain with spinal extension, he has been to several other providers, he had some knee arthritis, has had injections, muscle relaxers, nothing improved his symptoms, mild degenerative changes on x-rays. He had good dorsalis pedis and posterior tibial pulses, negative Homans' sign bilaterally. We suspected lumbar spinal stenosis, adding gabapentin, he is currently doing it twice a day and notes good improvement. We also obtained an MRI that confirmed lumbar spinal stenosis L4-L5, at this point he has had some relief but is eager to have more improvement, and does get slight sedation at his current dosing of gabapentin so we will proceed with an L4-L5 interlaminar epidural . Return to see me 6 weeks after the injection.    ____________________________________________ Ihor Austin. Benjamin Stain, M.D., ABFM., CAQSM., AME. Primary Care and Sports Medicine [place] [facility]  Adjunct Professor of Family Medicine  [place] of [facility] of Medicine  Restaurant manager, fast food

## 2022-04-24 NOTE — Assessment & Plan Note (Signed)
This is a very pleasant 76 year old male, long history of axial back pain radiating into the hamstrings, posterior knees and calfs, worse with walking. Reproduction of pain with spinal extension, he has been to several other providers, he had some knee arthritis, has had injections, muscle relaxers, nothing improved his symptoms, mild degenerative changes on x-rays. He had good dorsalis pedis and posterior tibial pulses, negative Homans' sign bilaterally. We suspected lumbar spinal stenosis, adding gabapentin, he is currently doing it twice a day and notes good improvement. We also obtained an MRI that confirmed lumbar spinal stenosis L4-L5, at this point he has had some relief but is eager to have more improvement, and does get slight sedation at his current dosing of gabapentin so we will proceed with an L4-L5 interlaminar epidural . Return to see me 6 weeks after the injection.

## 2022-05-08 ENCOUNTER — Ambulatory Visit
Admission: RE | Admit: 2022-05-08 | Discharge: 2022-05-08 | Disposition: A | Discharge status: Home / Self Care | Payer: Medicare HMO | Source: Ambulatory Visit | Attending: Sports Medicine | Admitting: Sports Medicine

## 2022-05-08 DIAGNOSIS — M79606 Pain in leg, unspecified: Secondary | ICD-10-CM | POA: Diagnosis not present

## 2022-05-08 DIAGNOSIS — M48062 Spinal stenosis, lumbar region with neurogenic claudication: Secondary | ICD-10-CM

## 2022-05-08 MED ORDER — IOPAMIDOL (ISOVUE-M 200) INJECTION 41%
1.0000 mL | Freq: Once | INTRAMUSCULAR | Status: AC
  Administered 2022-05-08: 1 mL via EPIDURAL

## 2022-05-08 MED ORDER — METHYLPREDNISOLONE ACETATE 40 MG/ML INJ SUSP (RADIOLOG
80.0000 mg | Freq: Once | INTRAMUSCULAR | Status: AC
  Administered 2022-05-08: 80 mg via EPIDURAL

## 2022-05-08 NOTE — Discharge Instructions (Signed)

## 2022-06-03 DIAGNOSIS — E782 Mixed hyperlipidemia: Secondary | ICD-10-CM | POA: Diagnosis not present

## 2022-06-03 DIAGNOSIS — N400 Enlarged prostate without lower urinary tract symptoms: Secondary | ICD-10-CM | POA: Diagnosis not present

## 2022-06-03 DIAGNOSIS — R7303 Prediabetes: Secondary | ICD-10-CM | POA: Diagnosis not present

## 2022-06-03 DIAGNOSIS — G4700 Insomnia, unspecified: Secondary | ICD-10-CM | POA: Diagnosis not present

## 2022-06-03 DIAGNOSIS — I10 Essential (primary) hypertension: Secondary | ICD-10-CM | POA: Diagnosis not present

## 2022-06-03 DIAGNOSIS — K219 Gastro-esophageal reflux disease without esophagitis: Secondary | ICD-10-CM | POA: Diagnosis not present

## 2022-06-03 DIAGNOSIS — N529 Male erectile dysfunction, unspecified: Secondary | ICD-10-CM | POA: Diagnosis not present

## 2022-06-03 DIAGNOSIS — Z23 Encounter for immunization: Secondary | ICD-10-CM | POA: Diagnosis not present

## 2022-06-05 DIAGNOSIS — U071 COVID-19: Secondary | ICD-10-CM | POA: Diagnosis not present

## 2022-06-18 DIAGNOSIS — H2513 Age-related nuclear cataract, bilateral: Secondary | ICD-10-CM | POA: Diagnosis not present

## 2022-07-04 ENCOUNTER — Ambulatory Visit (INDEPENDENT_AMBULATORY_CARE_PROVIDER_SITE_OTHER): Payer: Medicare HMO | Admitting: Sports Medicine

## 2022-07-04 DIAGNOSIS — M48062 Spinal stenosis, lumbar region with neurogenic claudication: Secondary | ICD-10-CM

## 2022-07-04 MED ORDER — GABAPENTIN 800 MG PO TABS: 800.0000 mg | ORAL_TABLET | Freq: Every day | ORAL | 3 refills | Status: DC

## 2022-07-04 NOTE — Progress Notes (Signed)
    Procedures performed today:    None.  Independent interpretation of notes and tests performed by another provider:   None.  Brief History, Exam, Impression, and Recommendations:    Lumbar stenosis with neurogenic claudication This very pleasant 77 year old male returns, he has MRI confirmed lumbar spinal stenosis with neurogenic claudication. He had a lumbar epidural last year, he did really well and is now having recurrence of discomfort. We will proceed with an additional L4-L5 interlaminar epidural, he understands that we can stack them as a series of 3 and a 79-month period of time. He is also doing well with gabapentin currently taking two 300 mg capsules at night, we will increase his dose to 800 mg at night, he is going to try 300 mg capsule during the day to see if he can tolerate it.  Chronic process with exacerbation and pharmacologic intervention  ____________________________________________ Gwen Her. Dianah Field, M.D., ABFM., CAQSM., AME. Primary Care and Sports Medicine [place] [facility]  Adjunct Professor of [place] of [facility] of Medicine  Risk manager

## 2022-07-04 NOTE — Assessment & Plan Note (Signed)
This very pleasant 77 year old male returns, he has MRI confirmed lumbar spinal stenosis with neurogenic claudication. He had a lumbar epidural last year, he did really well and is now having recurrence of discomfort. We will proceed with an additional L4-L5 interlaminar epidural, he understands that we can stack them as a series of 3 and a 47-month period of time. He is also doing well with gabapentin currently taking two 300 mg capsules at night, we will increase his dose to 800 mg at night, he is going to try 300 mg capsule during the day to see if he can tolerate it.

## 2022-07-08 DIAGNOSIS — H25812 Combined forms of age-related cataract, left eye: Secondary | ICD-10-CM | POA: Diagnosis not present

## 2022-07-08 DIAGNOSIS — H2511 Age-related nuclear cataract, right eye: Secondary | ICD-10-CM | POA: Diagnosis not present

## 2022-07-19 ENCOUNTER — Ambulatory Visit
Admission: RE | Admit: 2022-07-19 | Discharge: 2022-07-19 | Disposition: A | Discharge status: Home / Self Care | Payer: Medicare HMO | Source: Ambulatory Visit | Attending: Sports Medicine | Admitting: Sports Medicine

## 2022-07-19 DIAGNOSIS — M47817 Spondylosis without myelopathy or radiculopathy, lumbosacral region: Secondary | ICD-10-CM | POA: Diagnosis not present

## 2022-07-19 DIAGNOSIS — M48062 Spinal stenosis, lumbar region with neurogenic claudication: Secondary | ICD-10-CM

## 2022-07-19 MED ORDER — METHYLPREDNISOLONE ACETATE 40 MG/ML INJ SUSP (RADIOLOG
80.0000 mg | Freq: Once | INTRAMUSCULAR | Status: AC
  Administered 2022-07-19: 80 mg via EPIDURAL

## 2022-07-19 MED ORDER — IOPAMIDOL (ISOVUE-M 200) INJECTION 41%
1.0000 mL | Freq: Once | INTRAMUSCULAR | Status: AC
  Administered 2022-07-19: 1 mL via EPIDURAL

## 2022-07-19 NOTE — Discharge Instructions (Signed)

## 2022-08-07 DIAGNOSIS — H2511 Age-related nuclear cataract, right eye: Secondary | ICD-10-CM | POA: Diagnosis not present

## 2022-08-07 DIAGNOSIS — H269 Unspecified cataract: Secondary | ICD-10-CM | POA: Diagnosis not present

## 2022-08-27 ENCOUNTER — Ambulatory Visit (INDEPENDENT_AMBULATORY_CARE_PROVIDER_SITE_OTHER): Payer: Medicare HMO | Admitting: Sports Medicine

## 2022-08-27 DIAGNOSIS — M48062 Spinal stenosis, lumbar region with neurogenic claudication: Secondary | ICD-10-CM

## 2022-08-27 NOTE — Assessment & Plan Note (Signed)
MRI confirmed lumbar spinal stenosis, he did really well with epidural #1 and #2 L4-L5 interlaminar, currently doing gabapentin 300 mg during the day and 800 mg at night. Epidurals did well, he feels pretty good, a little discomfort is returning but he can live with it, he will have a very low threshold to call me for epidural #3 of 3.

## 2022-08-27 NOTE — Progress Notes (Signed)
    Procedures performed today:    None.  Independent interpretation of notes and tests performed by another provider:   None.  Brief History, Exam, Impression, and Recommendations:    Lumbar stenosis with neurogenic claudication MRI confirmed lumbar spinal stenosis, he did really well with epidural #1 and #2 L4-L5 interlaminar, currently doing gabapentin 300 mg during the day and 800 mg at night. Epidurals did well, he feels pretty good, a little discomfort is returning but he can live with it, he will have a very low threshold to call me for epidural #3 of 3.    ____________________________________________ Gwen Her. Dianah Field, M.D., ABFM., CAQSM., AME. Primary Care and Sports Medicine [place] [facility]  Adjunct Professor of [place] of [facility] of Medicine  Risk manager

## 2022-08-29 ENCOUNTER — Telehealth: Payer: Self-pay

## 2022-08-29 DIAGNOSIS — M48062 Spinal stenosis, lumbar region with neurogenic claudication: Secondary | ICD-10-CM

## 2022-08-29 NOTE — Telephone Encounter (Signed)
Epidural ordered, he should expect a call from [place] for scheduling

## 2022-08-29 NOTE — Telephone Encounter (Signed)
Patients wife came by office for epidural injection, patient would like to get an order for a new injection done at [facility], please advise,thanks.

## 2022-09-04 DIAGNOSIS — H2512 Age-related nuclear cataract, left eye: Secondary | ICD-10-CM | POA: Diagnosis not present

## 2022-09-04 DIAGNOSIS — H25812 Combined forms of age-related cataract, left eye: Secondary | ICD-10-CM | POA: Diagnosis not present

## 2022-09-04 DIAGNOSIS — H269 Unspecified cataract: Secondary | ICD-10-CM | POA: Diagnosis not present

## 2022-09-10 ENCOUNTER — Ambulatory Visit
Admission: RE | Admit: 2022-09-10 | Discharge: 2022-09-10 | Disposition: A | Discharge status: Home / Self Care | Payer: Medicare HMO | Source: Ambulatory Visit | Attending: Sports Medicine | Admitting: Sports Medicine

## 2022-09-10 DIAGNOSIS — M48062 Spinal stenosis, lumbar region with neurogenic claudication: Secondary | ICD-10-CM

## 2022-09-10 DIAGNOSIS — M47817 Spondylosis without myelopathy or radiculopathy, lumbosacral region: Secondary | ICD-10-CM | POA: Diagnosis not present

## 2022-09-10 MED ORDER — METHYLPREDNISOLONE ACETATE 40 MG/ML INJ SUSP (RADIOLOG
80.0000 mg | Freq: Once | INTRAMUSCULAR | Status: AC
  Administered 2022-09-10: 80 mg via EPIDURAL

## 2022-09-10 MED ORDER — IOPAMIDOL (ISOVUE-M 200) INJECTION 41%
1.0000 mL | Freq: Once | INTRAMUSCULAR | Status: AC
  Administered 2022-09-10: 1 mL via EPIDURAL

## 2022-09-10 NOTE — Discharge Instructions (Signed)

## 2022-09-29 ENCOUNTER — Other Ambulatory Visit: Payer: Self-pay | Admitting: Sports Medicine

## 2022-09-29 DIAGNOSIS — M48062 Spinal stenosis, lumbar region with neurogenic claudication: Secondary | ICD-10-CM

## 2022-10-08 DIAGNOSIS — R051 Acute cough: Secondary | ICD-10-CM | POA: Diagnosis not present

## 2022-11-14 ENCOUNTER — Ambulatory Visit (INDEPENDENT_AMBULATORY_CARE_PROVIDER_SITE_OTHER): Payer: Medicare HMO | Admitting: Sports Medicine

## 2022-11-14 DIAGNOSIS — M48062 Spinal stenosis, lumbar region with neurogenic claudication: Secondary | ICD-10-CM

## 2022-11-14 MED ORDER — GABAPENTIN 800 MG PO TABS: ORAL_TABLET | ORAL | 3 refills | Status: DC

## 2022-11-14 NOTE — Progress Notes (Signed)
    Procedures performed today:    None.  Independent interpretation of notes and tests performed by another provider:   None.  Brief History, Exam, Impression, and Recommendations:    Lumbar stenosis with neurogenic claudication Pleasant 77 year old male with MRI confirmed lumbar spinal stenosis worse at L4-L5, he has now had 3 epidurals, L4-L5 interlaminar's, the first couple did really well, the third has shown waning efficacy, he is doing okay, he takes gabapentin 300 mg in the morning and 800 mg at night. He is not sedated. We cannot do any more epidurals, so I will slightly more than double his gabapentin to 800 mg during the day and 1600 at night. Return to see me in 6 to 12 weeks as needed.    ____________________________________________ Ihor Austin. Benjamin Stain, M.D., ABFM., CAQSM., AME. Primary Care and Sports Medicine [place] [facility]  Adjunct Professor of Family Medicine  [place] of [facility] of Medicine  Restaurant manager, fast food

## 2022-11-14 NOTE — Assessment & Plan Note (Signed)
Pleasant 77 year old male with MRI confirmed lumbar spinal stenosis worse at L4-L5, he has now had 3 epidurals, L4-L5 interlaminar's, the first couple did really well, the third has shown waning efficacy, he is doing okay, he takes gabapentin 300 mg in the morning and 800 mg at night. He is not sedated. We cannot do any more epidurals, so I will slightly more than double his gabapentin to 800 mg during the day and 1600 at night. Return to see me in 6 to 12 weeks as needed.

## 2022-11-20 DIAGNOSIS — L820 Inflamed seborrheic keratosis: Secondary | ICD-10-CM | POA: Diagnosis not present

## 2022-11-20 DIAGNOSIS — L821 Other seborrheic keratosis: Secondary | ICD-10-CM | POA: Diagnosis not present

## 2022-11-20 DIAGNOSIS — L578 Other skin changes due to chronic exposure to nonionizing radiation: Secondary | ICD-10-CM | POA: Diagnosis not present

## 2022-11-20 DIAGNOSIS — L570 Actinic keratosis: Secondary | ICD-10-CM | POA: Diagnosis not present

## 2023-01-17 ENCOUNTER — Ambulatory Visit: Payer: Medicare HMO | Admitting: Sports Medicine

## 2023-01-21 DIAGNOSIS — K219 Gastro-esophageal reflux disease without esophagitis: Secondary | ICD-10-CM | POA: Diagnosis not present

## 2023-01-21 DIAGNOSIS — I10 Essential (primary) hypertension: Secondary | ICD-10-CM | POA: Diagnosis not present

## 2023-01-21 DIAGNOSIS — R7303 Prediabetes: Secondary | ICD-10-CM | POA: Diagnosis not present

## 2023-01-21 DIAGNOSIS — Z1331 Encounter for screening for depression: Secondary | ICD-10-CM | POA: Diagnosis not present

## 2023-01-21 DIAGNOSIS — N400 Enlarged prostate without lower urinary tract symptoms: Secondary | ICD-10-CM | POA: Diagnosis not present

## 2023-01-21 DIAGNOSIS — E782 Mixed hyperlipidemia: Secondary | ICD-10-CM | POA: Diagnosis not present

## 2023-01-21 DIAGNOSIS — M48062 Spinal stenosis, lumbar region with neurogenic claudication: Secondary | ICD-10-CM | POA: Diagnosis not present

## 2023-01-21 DIAGNOSIS — G4700 Insomnia, unspecified: Secondary | ICD-10-CM | POA: Diagnosis not present

## 2023-01-21 DIAGNOSIS — R195 Other fecal abnormalities: Secondary | ICD-10-CM | POA: Diagnosis not present

## 2023-01-21 DIAGNOSIS — N529 Male erectile dysfunction, unspecified: Secondary | ICD-10-CM | POA: Diagnosis not present

## 2023-01-21 DIAGNOSIS — Z23 Encounter for immunization: Secondary | ICD-10-CM | POA: Diagnosis not present

## 2023-01-21 DIAGNOSIS — Z0000 Encounter for general adult medical examination without abnormal findings: Secondary | ICD-10-CM | POA: Diagnosis not present

## 2023-01-28 ENCOUNTER — Ambulatory Visit (INDEPENDENT_AMBULATORY_CARE_PROVIDER_SITE_OTHER): Payer: Medicare HMO | Admitting: Sports Medicine

## 2023-01-28 ENCOUNTER — Encounter: Payer: Self-pay | Admitting: Sports Medicine

## 2023-01-28 DIAGNOSIS — M48062 Spinal stenosis, lumbar region with neurogenic claudication: Secondary | ICD-10-CM | POA: Diagnosis not present

## 2023-01-28 MED ORDER — GABAPENTIN 800 MG PO TABS: 3200.0000 mg | ORAL_TABLET | Freq: Every day | ORAL | 3 refills | Status: DC

## 2023-01-28 MED ORDER — GABAPENTIN 300 MG PO CAPS: 300.0000 mg | ORAL_CAPSULE | Freq: Every morning | ORAL | 3 refills | Status: DC

## 2023-01-28 NOTE — Assessment & Plan Note (Signed)
Pleasant 77 year old with male, MRI confirmed lumbar spinal stenosis worst L4-L5, he did really well with 3 epidurals starting in December of last year. We had to stop as he exceeded the limit, we can go ahead and restart as we are 9 months past his first epidural. He is also doing well with gabapentin 300 mg in the morning and 3200 mg at night. He is not sedated.  Of note he is reporting some tenesmus in the mornings followed by few episodes of stooling and then relief.  No melena, hematochezia, no hematemesis. 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

## 2023-01-28 NOTE — Progress Notes (Signed)
    Procedures performed today:    None.  Independent interpretation of notes and tests performed by another provider:   None.  Brief History, Exam, Impression, and Recommendations:    Lumbar stenosis with neurogenic claudication Pleasant 77 year old with male, MRI confirmed lumbar spinal stenosis worst L4-L5, he did really well with 3 epidurals starting in December of last year. We had to stop as he exceeded the limit, we can go ahead and restart as we are 9 months past his first epidural. He is also doing well with gabapentin 300 mg in the morning and 3200 mg at night. He is not sedated.  Of note he is reporting some tenesmus in the mornings followed by few episodes of stooling and then relief.  No melena, hematochezia, no hematemesis. 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    ____________________________________________ Alexander Glover. Benjamin Stain, M.D., ABFM., CAQSM., AME. Primary Care and Sports Medicine [place] [facility]  Adjunct Professor of Family Medicine  [place] of [facility] of Medicine  Restaurant manager, fast food

## 2023-02-11 NOTE — Discharge Instructions (Signed)
Post Procedure Spinal Discharge Instruction Sheet  You may resume a regular diet and any medications that you routinely take (including pain medications) unless otherwise noted by MD.  No driving day of procedure.  Light activity throughout the rest of the day.  Do not do any strenuous work, exercise, bending or lifting.  The day following the procedure, you can resume normal physical activity but you should refrain from exercising or physical therapy for at least three days thereafter.  You may apply ice to the injection site, 20 minutes on, 20 minutes off, as needed. Do not apply ice directly to skin.    Common Side Effects:  Headaches- take your usual medications as directed by your physician.  Increase your fluid intake.  Caffeinated beverages may be helpful.  Lie flat in bed until your headache resolves.  Restlessness or inability to sleep- you may have trouble sleeping for the next few days.  Ask your referring physician if you need any medication for sleep.  Facial flushing or redness- should subside within a few days.  Increased pain- a temporary increase in pain a day or two following your procedure is not unusual.  Take your pain medication as prescribed by your referring physician.  Leg cramps  Please contact our office at [phone] for the following symptoms: Fever greater than 100 degrees. Headaches unresolved with medication after 2-3 days. Increased swelling, pain, or redness at injection site.   Thank you for visiting [facility] today.    YOU MAY RESUME YOUR ASPIRIN TODAY, POST PROCEDURE.

## 2023-02-12 ENCOUNTER — Ambulatory Visit
Admission: RE | Admit: 2023-02-12 | Discharge: 2023-02-12 | Disposition: A | Discharge status: Home / Self Care | Payer: Medicare HMO | Source: Ambulatory Visit | Attending: Sports Medicine | Admitting: Sports Medicine

## 2023-02-12 DIAGNOSIS — M48062 Spinal stenosis, lumbar region with neurogenic claudication: Secondary | ICD-10-CM

## 2023-02-12 DIAGNOSIS — M4727 Other spondylosis with radiculopathy, lumbosacral region: Secondary | ICD-10-CM | POA: Diagnosis not present

## 2023-02-12 MED ORDER — METHYLPREDNISOLONE ACETATE 40 MG/ML INJ SUSP (RADIOLOG
80.0000 mg | Freq: Once | INTRAMUSCULAR | Status: AC
  Administered 2023-02-12: 80 mg via EPIDURAL

## 2023-02-12 MED ORDER — IOPAMIDOL (ISOVUE-M 200) INJECTION 41%
1.0000 mL | Freq: Once | INTRAMUSCULAR | Status: AC
  Administered 2023-02-12: 1 mL via EPIDURAL

## 2023-03-21 ENCOUNTER — Ambulatory Visit (INDEPENDENT_AMBULATORY_CARE_PROVIDER_SITE_OTHER): Payer: Medicare HMO | Admitting: Sports Medicine

## 2023-03-21 ENCOUNTER — Encounter: Payer: Self-pay | Admitting: Sports Medicine

## 2023-03-21 DIAGNOSIS — M48062 Spinal stenosis, lumbar region with neurogenic claudication: Secondary | ICD-10-CM | POA: Diagnosis not present

## 2023-03-21 NOTE — Assessment & Plan Note (Signed)
This pleasant 77 year old male returns, known lumbar spinal stenosis worst L4-L5, historically he did well with 3 epidurals, he had another epidural recently and did not get very much relief. He continues gabapentin high dose. He did some home therapy, in the spirit of keeping him out of the operating room going to add some formal physical therapy and proceed with another epidural, at this time bilateral L4-L5 transforaminal. Return to see me in about 6 weeks. If insufficient improvement at the 6-week follow-up we will get neurosurgical consultation.

## 2023-03-21 NOTE — Progress Notes (Signed)
    Procedures performed today:    None.  Independent interpretation of notes and tests performed by another provider:   None.  Brief History, Exam, Impression, and Recommendations:    Lumbar stenosis with neurogenic claudication This pleasant 77 year old male returns, known lumbar spinal stenosis worst L4-L5, historically he did well with 3 epidurals, he had another epidural recently and did not get very much relief. He continues gabapentin high dose. He did some home therapy, in the spirit of keeping him out of the operating room going to add some formal physical therapy and proceed with another epidural, at this time bilateral L4-L5 transforaminal. Return to see me in about 6 weeks. If insufficient improvement at the 6-week follow-up we will get neurosurgical consultation.    ____________________________________________ Ihor Austin. Benjamin Stain, M.D., ABFM., CAQSM., AME. Primary Care and Sports Medicine [place] [facility]  Adjunct Professor of Family Medicine  [place] of [facility] of Medicine  Restaurant manager, fast food

## 2023-03-28 NOTE — Discharge Instructions (Signed)

## 2023-03-31 ENCOUNTER — Other Ambulatory Visit: Payer: Self-pay | Admitting: Sports Medicine

## 2023-03-31 ENCOUNTER — Inpatient Hospital Stay
Admission: RE | Admit: 2023-03-31 | Discharge: 2023-03-31 | Disposition: A | Discharge status: Home / Self Care | Payer: Medicare HMO | Source: Ambulatory Visit | Attending: Sports Medicine | Admitting: Sports Medicine

## 2023-03-31 DIAGNOSIS — M48062 Spinal stenosis, lumbar region with neurogenic claudication: Secondary | ICD-10-CM

## 2023-03-31 DIAGNOSIS — M5416 Radiculopathy, lumbar region: Secondary | ICD-10-CM | POA: Diagnosis not present

## 2023-03-31 DIAGNOSIS — M48061 Spinal stenosis, lumbar region without neurogenic claudication: Secondary | ICD-10-CM | POA: Diagnosis not present

## 2023-03-31 MED ORDER — METHYLPREDNISOLONE ACETATE 40 MG/ML INJ SUSP (RADIOLOG
80.0000 mg | Freq: Once | INTRAMUSCULAR | Status: AC
  Administered 2023-03-31: 80 mg via EPIDURAL

## 2023-03-31 MED ORDER — IOPAMIDOL (ISOVUE-M 200) INJECTION 41%
1.0000 mL | Freq: Once | INTRAMUSCULAR | Status: AC
  Administered 2023-03-31: 1 mL via EPIDURAL

## 2023-05-02 ENCOUNTER — Ambulatory Visit (INDEPENDENT_AMBULATORY_CARE_PROVIDER_SITE_OTHER): Payer: Medicare HMO | Admitting: Sports Medicine

## 2023-05-02 DIAGNOSIS — M48062 Spinal stenosis, lumbar region with neurogenic claudication: Secondary | ICD-10-CM

## 2023-05-02 NOTE — Progress Notes (Signed)
    Procedures performed today:    None.  Independent interpretation of notes and tests performed by another provider:   None.  Brief History, Exam, Impression, and Recommendations:    Lumbar stenosis with neurogenic claudication Alexander Glover is a very pleasant 77 year old male with known lumbar spinal stenosis worst L4-L5, historically he has done well with epidurals, it did take 3. He continues gabapentin high dose. We did set him up recently with a L4-L5 interlaminar epidural and he did not get good relief, the second time around we proceeded with bilateral L4-L5 transforaminal epidurals and he has responded well to this approach. He is almost pain-free, he can return to see me as needed and from now on if he needs epidurals we should use the transforaminal approach.    ____________________________________________ Ihor Austin. Benjamin Stain, M.D., ABFM., CAQSM., AME. Primary Care and Sports Medicine [facility]  Adjunct Professor of Family Medicine  [place] [employer]  Restaurant manager, fast food

## 2023-05-02 NOTE — Assessment & Plan Note (Signed)
Alexander Glover is a very pleasant 77 year old male with known lumbar spinal stenosis worst L4-L5, historically he has done well with epidurals, it did take 3. He continues gabapentin high dose. We did set him up recently with a L4-L5 interlaminar epidural and he did not get good relief, the second time around we proceeded with bilateral L4-L5 transforaminal epidurals and he has responded well to this approach. He is almost pain-free, he can return to see me as needed and from now on if he needs epidurals we should use the transforaminal approach.

## 2023-05-05 ENCOUNTER — Encounter: Payer: Self-pay | Admitting: Physician Assistant

## 2023-05-05 ENCOUNTER — Telehealth: Payer: Self-pay | Admitting: Internal Medicine

## 2023-05-05 NOTE — Telephone Encounter (Signed)
Scheduled OV with Zerita Boers 07/21/2023

## 2023-05-05 NOTE — Telephone Encounter (Signed)
Good afternoon  The following patient is requesting you for continued care for IBS symptoms. He has been a patient with [facility] is not happy. Records are available with care everywhere. He is also asking that if he cannont be scheduled directly with Hyacinth Meeker. Please advise.

## 2023-05-05 NOTE — Telephone Encounter (Signed)
Okay to schedule for OV

## 2023-06-17 ENCOUNTER — Telehealth: Payer: Self-pay | Admitting: Family Medicine

## 2023-06-17 DIAGNOSIS — M48062 Spinal stenosis, lumbar region with neurogenic claudication: Secondary | ICD-10-CM

## 2023-06-17 NOTE — Telephone Encounter (Signed)
Pt called. He is ready to get lined up for Epidural per conversation with you.

## 2023-06-17 NOTE — Telephone Encounter (Signed)
Bilateral L4-L5 transforaminal epidurals ordered, patient will be contacted by [facility] imaging for scheduling.

## 2023-06-23 NOTE — Discharge Instructions (Signed)
Post Procedure Spinal Discharge Instruction Sheet  You may resume a regular diet and any medications that you routinely take (including pain medications) unless otherwise noted by MD.  No driving day of procedure.  Light activity throughout the rest of the day.  Do not do any strenuous work, exercise, bending or lifting.  The day following the procedure, you can resume normal physical activity but you should refrain from exercising or physical therapy for at least three days thereafter.  You may apply ice to the injection site, 20 minutes on, 20 minutes off, as needed. Do not apply ice directly to skin.    Common Side Effects:  Headaches- take your usual medications as directed by your physician.  Increase your fluid intake.  Caffeinated beverages may be helpful.  Lie flat in bed until your headache resolves.  Restlessness or inability to sleep- you may have trouble sleeping for the next few days.  Ask your referring physician if you need any medication for sleep.  Facial flushing or redness- should subside within a few days.  Increased pain- a temporary increase in pain a day or two following your procedure is not unusual.  Take your pain medication as prescribed by your referring physician.  Leg cramps  Please contact our office at [phone] for the following symptoms: Fever greater than 100 degrees. Headaches unresolved with medication after 2-3 days. Increased swelling, pain, or redness at injection site.   Thank you for visiting [facility] today.   YOU MAY RESUME YOUR ASPIRIN ANYTIME AFTER PROCEDURE TODAY

## 2023-06-24 ENCOUNTER — Inpatient Hospital Stay
Admission: RE | Admit: 2023-06-24 | Discharge: 2023-06-24 | Disposition: A | Discharge status: Home / Self Care | Payer: Medicare HMO | Source: Ambulatory Visit | Attending: Sports Medicine | Admitting: Sports Medicine

## 2023-07-01 ENCOUNTER — Encounter: Payer: Self-pay | Admitting: Sports Medicine

## 2023-07-02 ENCOUNTER — Telehealth: Payer: Self-pay

## 2023-07-02 NOTE — Telephone Encounter (Signed)
 Copied from CRM [id number]. Topic: General - Other >> Jul 01, 2023 12:29 PM Hilton Lucky wrote: Reason for CRM: [facility] calling to state nerve block has been denied by insurance, form for peer-to peer has been faxed over for an apeal/approval.

## 2023-07-03 NOTE — Telephone Encounter (Signed)
 I initiated an expedited appeal for the patient's epidural. He does meet criteria.  Reference #[id number] Appeal [phone] Member #[id number]  Insurance company will let us  know the outcome of the expedited appeal.  Please let the patient know that I have called on his behalf and we are waiting on the insurance company decision.

## 2023-07-03 NOTE — Telephone Encounter (Signed)
 Patient informed.

## 2023-07-04 NOTE — Telephone Encounter (Signed)
 Copied from CRM [id number]. Topic: General - Other >> Jul 04, 2023 12:27 PM Bascom RAMAN wrote: Reason for CRM: [employer] calling regarding appeal for lumbar injection. Callback number [phone]. Appeal does not qualify Expedited appeal. Appeal will be processed within 7 days. Please call back today if possible.

## 2023-07-08 NOTE — Telephone Encounter (Signed)
Please let him know we have tried and that his insurance company is delaying everything, and then please forward all of this to our E2C2 prior authorization and appeals people.

## 2023-07-15 DIAGNOSIS — R252 Cramp and spasm: Secondary | ICD-10-CM | POA: Diagnosis not present

## 2023-07-21 ENCOUNTER — Ambulatory Visit (INDEPENDENT_AMBULATORY_CARE_PROVIDER_SITE_OTHER): Payer: Medicare HMO | Admitting: Physician Assistant

## 2023-07-21 ENCOUNTER — Other Ambulatory Visit (INDEPENDENT_AMBULATORY_CARE_PROVIDER_SITE_OTHER): Payer: Medicare HMO

## 2023-07-21 ENCOUNTER — Encounter: Payer: Self-pay | Admitting: Physician Assistant

## 2023-07-21 ENCOUNTER — Telehealth: Payer: Self-pay

## 2023-07-21 VITALS — BP 132/72 | HR 68 | Ht 68.0 in | Wt 210.0 lb

## 2023-07-21 DIAGNOSIS — R197 Diarrhea, unspecified: Secondary | ICD-10-CM

## 2023-07-21 DIAGNOSIS — K219 Gastro-esophageal reflux disease without esophagitis: Secondary | ICD-10-CM

## 2023-07-21 DIAGNOSIS — K625 Hemorrhage of anus and rectum: Secondary | ICD-10-CM

## 2023-07-21 DIAGNOSIS — R1084 Generalized abdominal pain: Secondary | ICD-10-CM

## 2023-07-21 LAB — CBC WITH DIFFERENTIAL/PLATELET
Basophils Absolute: 0.1 10*3/uL (ref 0.0–0.1)
Basophils Relative: 1.1 % (ref 0.0–3.0)
Eosinophils Absolute: 0.2 10*3/uL (ref 0.0–0.7)
Eosinophils Relative: 2.6 % (ref 0.0–5.0)
HCT: 43.2 % (ref 39.0–52.0)
Hemoglobin: 14.5 g/dL (ref 13.0–17.0)
Lymphocytes Relative: 25 % (ref 12.0–46.0)
Lymphs Abs: 2.2 10*3/uL (ref 0.7–4.0)
MCHC: 33.5 g/dL (ref 30.0–36.0)
MCV: 85.3 fl (ref 78.0–100.0)
Monocytes Absolute: 0.8 10*3/uL (ref 0.1–1.0)
Monocytes Relative: 9.5 % (ref 3.0–12.0)
Neutro Abs: 5.4 10*3/uL (ref 1.4–7.7)
Neutrophils Relative %: 61.8 % (ref 43.0–77.0)
Platelets: 257 10*3/uL (ref 150.0–400.0)
RBC: 5.06 Mil/uL (ref 4.22–5.81)
RDW: 13.1 % (ref 11.5–15.5)
WBC: 8.8 10*3/uL (ref 4.0–10.5)

## 2023-07-21 LAB — COMPREHENSIVE METABOLIC PANEL
ALT: 34 U/L (ref 0–53)
AST: 40 U/L — ABNORMAL HIGH (ref 0–37)
Albumin: 4.7 g/dL (ref 3.5–5.2)
Alkaline Phosphatase: 60 U/L (ref 39–117)
BUN: 19 mg/dL (ref 6–23)
CO2: 26 meq/L (ref 19–32)
Calcium: 9.8 mg/dL (ref 8.4–10.5)
Chloride: 100 meq/L (ref 96–112)
Creatinine, Ser: 1.13 mg/dL (ref 0.40–1.50)
GFR: 63.01 mL/min (ref >60.00)
Glucose, Bld: 121 mg/dL — ABNORMAL HIGH (ref 70–99)
Potassium: 4.2 meq/L (ref 3.5–5.1)
Sodium: 138 meq/L (ref 135–145)
Total Bilirubin: 0.9 mg/dL (ref 0.2–1.2)
Total Protein: 7.1 g/dL (ref 6.0–8.3)

## 2023-07-21 LAB — TSH: TSH: 3.82 u[IU]/mL (ref 0.35–5.50)

## 2023-07-21 MED ORDER — DICYCLOMINE HCL 10 MG PO CAPS: 10.0000 mg | ORAL_CAPSULE | Freq: Every morning | ORAL | 5 refills | Status: DC

## 2023-07-21 NOTE — Telephone Encounter (Signed)
 Pharmacy Patient Advocate Encounter   Received notification from CoverMyMeds that prior authorization for Dicyclomine 10 mg capsule is required/requested.   Insurance verification completed.   The patient is insured through [employer] .   Per test claim: PA required; PA submitted to above mentioned insurance via CoverMyMeds Key/confirmation #/EOC [id number] Status is pending

## 2023-07-21 NOTE — Progress Notes (Signed)
 Chief Complaint: Diarrhea  HPI:    [name] is a 78 year old male with a past medical history as listed below including fatty liver disease, who was referred to me by Alexander Joe, MD for a complaint of diarrhea.    Per phone notes patient previously followed Dr. Ewing Glover but was not very happy and requested transfer here.    11/01/2002 colonoscopy with diverticulosis and possible small polyp.  Repeat recommended 10 years.    Today, patient presents to clinic with his wife who is a patient of mine.  He explains that he has been following with Dr. Ewing Glover for his colonoscopies and in fact had one last year and was told it was normal and he would not need another one.  Tells me he has always had a "nervous stomach", often noting that he will have urgent loose stool if he is going to go anywhere but over the past year it seems to have worsened.  Tells me he walks every day and recently has had issues with urgent stool hitting him and will have to be picked up by his wife in order to get home to go to the bathroom or will be driving to church on a Sunday and have to turn around and go back home because he feels like he is going to have lots of diarrhea.  He has occasionally tried Imodium which does stop him up for the day but then also slows his next day which makes him somewhat nervous.  Tells me typically he will have 3-4 bowel movements in the morning and then feel okay.  He has tried to avoid things that seem to make this worse such as chocolate but remains with a feeling of "uneasiness" in his stomach as well as urgent diarrhea most days.  Tells me he cannot remember the last time he had a fully formed stool but it does happen occasionally.       Also notes issues with a bleeding hemorrhoid per him that lasted for a week or 2 about 2 to 3 weeks ago.  Tells me this went away as quickly as it came.  He has not seen any bleeding since.    Also currently on Omeprazole he thinks 20 mg in the morning for reflux  symptoms.  Tells me he was experiencing some dysphagia but on this medicine he has no problem.  (It is not on his med list and he cannot remember the exact dose).  Also currently on a garlic supplement.    Denies fever, chills, weight loss, nausea, vomiting or symptoms that awaken him from sleep.  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 30 tablet 0   amLODipine (NORVASC) 5 MG tablet Take 5 mg by mouth daily. 1/2 in the morning and 1 in the evening     aspirin EC 81 MG tablet Take 81 mg by mouth daily.  atenolol-chlorthalidone (TENORETIC) 50-25 MG tablet Take 0.5 tablets by mouth daily.     Cranberry 425 MG CAPS Take by mouth.     gabapentin (NEURONTIN) 300 MG capsule Take 1 capsule (300 mg total) by mouth in the morning. 90 capsule 3   gabapentin (NEURONTIN) 800 MG tablet Take 4 tablets (3,200 mg total) by mouth at bedtime. 360 tablet 3   Garlic 500 MG CAPS Take by mouth.     Ginkgo Biloba (GINKOBA PO) Take by mouth.     irbesartan (AVAPRO) 300 MG tablet Take 300 mg by mouth daily.     Multiple Vitamin (MULTIVITAMIN) tablet Take 1 tablet by mouth daily.     Multiple Vitamins-Minerals (CORAL CALCIUM PLUS PO) Take by mouth.     Omega-3 Fatty Acids (FISH OIL) 1000 MG CAPS Take 1,500 mg by mouth daily.     potassium chloride SA (K-DUR,KLOR-CON) 20 MEQ tablet Take 1 tablet (20  mEq total) by mouth daily. 7 tablet 0   saccharomyces boulardii (FLORASTOR) 250 MG capsule You can buy this at any drug store.  You can use as directed for the next 2 weeks.     sildenafil (REVATIO) 20 MG tablet Take 20 mg by mouth 3 (three) times daily.     simvastatin (ZOCOR) 40 MG tablet Take 40 mg by mouth at bedtime.     tamsulosin (FLOMAX) 0.4 MG CAPS capsule Take 0.4 mg by mouth at bedtime.     triazolam (HALCION) 0.25 MG tablet 1-2 tabs PO 2 hours before procedure or imaging.  Do not drive with this medication. 2 tablet 0   vitamin C (ASCORBIC ACID) 500 MG tablet Take 500 mg by mouth daily.     zinc gluconate 50 MG tablet Take 50 mg by mouth daily.     No current facility-administered medications for this visit.    Allergies as of 07/21/2023 - Review Complete 07/21/2023  Allergen Reaction Noted   Suvorexant  08/27/2022    Family History  Problem Relation Age of Onset   Diabetes Maternal Grandmother    Colon cancer Neg Hx    Stomach cancer Neg Hx    Esophageal cancer Neg Hx     Social History   Socioeconomic History   Marital status: Married    Spouse name: Alexander Glover   Number of children: 2   Years of education: Not on file   Highest education level: Not on file  Occupational History   Occupation: retired  Tobacco Use   Smoking status: Never   Smokeless tobacco: Never  Vaping Use   Vaping status: Never Used  Substance and Sexual Activity   Alcohol use: Never   Drug use: Not on file   Sexual activity: Not on file  Other Topics Concern   Not on file  Social History Narrative   Lives with wife and daughter   Right Handed   Drinks 1-2 cups caffeine daily   Social Drivers of Corporate investment banker Strain: Not on file  Food Insecurity: Not on file  Transportation Needs: Not on file  Physical Activity: Not on file  Stress: Not on file  Social Connections: Unknown (10/09/2021)   Received from [facility], [facility]   Social Network    Social Network:  Not on file  Intimate Partner Violence: Unknown (08/31/2021)   Received from [facility], [facility]   HITS    Physically Hurt: Not on file    Insult or Talk Down To: Not on file  Threaten Physical Harm: Not on file    Scream or Curse: Not on file    Review of Systems:    Constitutional: No weight loss, fever or chills Skin: No rash Cardiovascular: No chest pain Respiratory: No SOB Gastrointestinal: See HPI and otherwise negative Genitourinary: No dysuria  Neurological: No headache, dizziness or syncope Musculoskeletal: No new muscle or joint pain Hematologic: No bruising Psychiatric: No history of depression or anxiety   Physical Exam:  Vital signs: BP 132/72   Pulse 68   Ht 5\' 8"  (1.727 m)   Wt 210 lb (95.3 kg)   BMI 31.93 kg/m    Constitutional:   Pleasant Elderly Caucasian male appears to be in NAD, Well developed, Well nourished, alert and cooperative Head:  Normocephalic and atraumatic. Eyes:   PEERL, EOMI. No icterus. Conjunctiva pink. Ears:  Normal auditory acuity. Neck:  Supple Throat: Oral cavity and pharynx without inflammation, swelling or lesion.  Respiratory: Respirations even and unlabored. Lungs clear to auscultation bilaterally.   No wheezes, crackles, or rhonchi.  Cardiovascular: Normal S1, S2. No MRG. Regular rate and rhythm. No peripheral edema, cyanosis or pallor.  Gastrointestinal:  Soft, nondistended, nontender. No rebound or guarding. Normal bowel sounds. No appreciable masses or hepatomegaly. Rectal:  Not performed.  Msk:  Symmetrical without gross deformities. Without edema, no deformity or joint abnormality.  Neurologic:  Alert and  oriented x4;  grossly normal neurologically.  Skin:   Dry and intact without significant lesions or rashes. Psychiatric: Demonstrates good judgement and reason without abnormal affect or behaviors.  RELEVANT LABS AND IMAGING: CBC    Component Value Date/Time   WBC 9.5 09/02/2017 0235   RBC 4.95  09/02/2017 0235   HGB 14.2 09/02/2017 0235   HCT 41.7 09/02/2017 0235   PLT 219 09/02/2017 0235   MCV 84.2 09/02/2017 0235   MCH 28.7 09/02/2017 0235   MCHC 34.1 09/02/2017 0235   RDW 13.4 09/02/2017 0235    CMP     Component Value Date/Time   NA 135 09/02/2017 0235   K 2.8 (L) 09/02/2017 0235   CL 102 09/02/2017 0235   CO2 22 09/02/2017 0235   GLUCOSE 128 (H) 09/02/2017 0235   BUN 17 09/02/2017 0235   CREATININE 0.85 09/02/2017 0235   CALCIUM 9.4 09/02/2017 0235   PROT 6.8 09/11/2016 0912   ALBUMIN 3.5 09/11/2016 0912   AST 55 (H) 09/11/2016 0912   ALT 47 09/11/2016 0912   ALKPHOS 117 09/11/2016 0912   BILITOT 1.4 (H) 09/11/2016 0912   GFRNONAA >60 09/02/2017 0235   GFRAA >60 09/02/2017 0235    Assessment: 1.  Diarrhea: Seems to come after a generalized abdominal discomfort and then be urgent, typically 3-4 times in the morning and occasionally if he tries to leave the house, worse over the past year, colonoscopy last year per him was normal; consider IBS versus celiac versus diet versus supplement side effect versus thyroid versus less likely colitis 2.  Generalized abdominal discomfort: With above 3.  GERD: Controlled on Omeprazole 20 mg, was really feeling of dysphagia rather than true reflux/heartburn 4.  Rectal bleeding: For about a week couple of weeks ago, likely a hemorrhoid given all the diarrhea, up-to-date on colonoscopy per him  Plan: 1.  Requesting patient's records from Dr. Ewing Glover.  Including last colonoscopy which he reports a year ago. 2.  Briefly discussed rectal bleeding, likely a hemorrhoid if it is gone now and he is up-to-date with colon cancer screening but will review records.  3.  Discussed diarrhea and we will order labs including CBC, CMP, TSH and IgA/I TTG for further evaluation. 4.  Also recommend he hold his Garlic supplement 5.  Discussed the use of Dicyclomine 10 mg every morning as most of his symptoms happen then and then as needed in between  if he gets a feeling of discomfort.  Prescribed #30 with 5 refills 6.  Did discuss that Imodium is safe to use, could try a half a tab in the future if Dicyclomine not helping 7.  Patient to continue Omeprazole 20 mg every morning 8.  Briefly discussed diet changes 9.  Patient to follow in clinic with me in 2 to 3 months.  Assigned to Dr. Adela Lank this morning.  Hyacinth Meeker, PA-C [facility] 07/21/2023, 10:27 AM  Cc: Alexander Joe, MD

## 2023-07-21 NOTE — Patient Instructions (Addendum)
 Your provider has requested that you go to the basement level for lab work before leaving today. Press "B" on the elevator. The lab is located at the first door on the left as you exit the elevator.  Hold Garlic Supplement  We have sent the following medications to your pharmacy for you to pick up at your convenience:  Dicyclomine 10mg  every morning  _______________________________________________________  If your blood pressure at your visit was 140/90 or greater, please contact your primary care physician to follow up on this.  _______________________________________________________  If you are age 74 or older, your body mass index should be between 23-30. Your Body mass index is 31.93 kg/m. If this is out of the aforementioned range listed, please consider follow up with your Primary Care Provider.  If you are age 12 or younger, your body mass index should be between 19-25. Your Body mass index is 31.93 kg/m. If this is out of the aformentioned range listed, please consider follow up with your Primary Care Provider.   ________________________________________________________  The [facility] providers would like to encourage you to use [facility] to communicate with providers for non-urgent requests or questions.  Due to long hold times on the telephone, sending your provider a message by [facility] may be a faster and more efficient way to get a response.  Please allow 48 business hours for a response.  Please remember that this is for non-urgent requests.  _______________________________________________________  Thank you for entrusting me with your care and choosing [facility].  Vicente Serene, PA

## 2023-07-22 LAB — TISSUE TRANSGLUTAMINASE ABS,IGG,IGA
(tTG) Ab, IgA: <1 U/mL
(tTG) Ab, IgG: <1 U/mL

## 2023-07-22 LAB — IGA: Immunoglobulin A: 64 mg/dL — ABNORMAL LOW (ref 70–320)

## 2023-07-22 NOTE — Progress Notes (Signed)
 Agree with assessment and plan as outlined. If measures fail to improve his symptoms, assuming he was tested for microscopic colitis and negative, consider empiric cholestyramine or Colestid

## 2023-07-24 ENCOUNTER — Other Ambulatory Visit: Payer: Self-pay | Admitting: Sports Medicine

## 2023-07-24 ENCOUNTER — Ambulatory Visit
Admission: RE | Admit: 2023-07-24 | Discharge: 2023-07-24 | Disposition: A | Discharge status: Home / Self Care | Payer: Medicare HMO | Source: Ambulatory Visit | Attending: Sports Medicine | Admitting: Sports Medicine

## 2023-07-24 DIAGNOSIS — M48062 Spinal stenosis, lumbar region with neurogenic claudication: Secondary | ICD-10-CM

## 2023-07-24 DIAGNOSIS — M4727 Other spondylosis with radiculopathy, lumbosacral region: Secondary | ICD-10-CM | POA: Diagnosis not present

## 2023-07-24 MED ORDER — METHYLPREDNISOLONE ACETATE 40 MG/ML INJ SUSP (RADIOLOG
80.0000 mg | Freq: Once | INTRAMUSCULAR | Status: AC
  Administered 2023-07-24: 80 mg via EPIDURAL

## 2023-07-24 MED ORDER — IOPAMIDOL (ISOVUE-M 200) INJECTION 41%
1.0000 mL | Freq: Once | INTRAMUSCULAR | Status: AC
  Administered 2023-07-24: 1 mL via EPIDURAL

## 2023-07-24 NOTE — Telephone Encounter (Signed)
 Patient states that he already received the approval and had injection today - was in recovery at time of call.

## 2023-07-24 NOTE — Discharge Instructions (Signed)

## 2023-08-04 NOTE — Telephone Encounter (Signed)
 Pharmacy Patient Advocate Encounter  Received notification from [employer] that Prior Authorization for DICYCLOMINE has been APPROVED from 05/28/23 to 05/26/24   PA #/Case ID/Reference #:   [id number]

## 2023-08-18 ENCOUNTER — Telehealth: Payer: Self-pay | Admitting: Physician Assistant

## 2023-08-18 MED ORDER — DICYCLOMINE HCL 10 MG PO CAPS: 10.0000 mg | ORAL_CAPSULE | Freq: Every morning | ORAL | 5 refills | Status: DC

## 2023-08-18 NOTE — Telephone Encounter (Signed)
 Patient called to request a refill on Dicyclomine.

## 2023-08-18 NOTE — Telephone Encounter (Signed)
 Refill sent.

## 2023-09-08 DIAGNOSIS — I10 Essential (primary) hypertension: Secondary | ICD-10-CM | POA: Diagnosis not present

## 2023-09-08 DIAGNOSIS — J069 Acute upper respiratory infection, unspecified: Secondary | ICD-10-CM | POA: Diagnosis not present

## 2023-09-08 DIAGNOSIS — R051 Acute cough: Secondary | ICD-10-CM | POA: Diagnosis not present

## 2023-09-19 ENCOUNTER — Ambulatory Visit: Payer: Self-pay

## 2023-09-19 ENCOUNTER — Telehealth: Payer: Self-pay | Admitting: Sports Medicine

## 2023-09-19 DIAGNOSIS — M48062 Spinal stenosis, lumbar region with neurogenic claudication: Secondary | ICD-10-CM

## 2023-09-19 NOTE — Telephone Encounter (Signed)
 Bilateral L4-L5 transforaminal epidurals ordered

## 2023-09-19 NOTE — Telephone Encounter (Addendum)
 Transferred pt to [facility] for possible appt with Dr. Elva Hamburger  Copied from CRM [id number]. Topic: Clinical - Red Word Triage >> Sep 19, 2023 10:40 AM Retta Caster wrote: Red Word that prompted transfer to Nurse Triage: Patient feeeling numbness in both sides due to pinch nerve issue. Last 1-2 days getting worse. Had corticone shots on 07/24/23

## 2023-09-19 NOTE — Telephone Encounter (Signed)
 Patient called in stating that he need another lumbar injection sent in the previous one has worn off.

## 2023-09-19 NOTE — Telephone Encounter (Signed)
 Epidural ordered by Dr Sandy Crumb.

## 2023-09-24 NOTE — Discharge Instructions (Addendum)
 Post Procedure Spinal Discharge Instruction Sheet  You may resume a regular diet and any medications that you routinely take (including pain medications) unless otherwise noted by MD.  No driving day of procedure.  Light activity throughout the rest of the day.  Do not do any strenuous work, exercise, bending or lifting.  The day following the procedure, you can resume normal physical activity but you should refrain from exercising or physical therapy for at least three days thereafter.  You may apply ice to the injection site, 20 minutes on, 20 minutes off, as needed. Do not apply ice directly to skin.    Common Side Effects:  Headaches- take your usual medications as directed by your physician.  Increase your fluid intake.  Caffeinated beverages may be helpful.  Lie flat in bed until your headache resolves.  Restlessness or inability to sleep- you may have trouble sleeping for the next few days.  Ask your referring physician if you need any medication for sleep.  Facial flushing or redness- should subside within a few days.  Increased pain- a temporary increase in pain a day or two following your procedure is not unusual.  Take your pain medication as prescribed by your referring physician.  Leg cramps  Please contact our office at [phone] for the following symptoms: Fever greater than 100 degrees. Headaches unresolved with medication after 2-3 days. Increased swelling, pain, or redness at injection site.   YOU MAY RESUME TAKING YOUR ASPIRIN TODAY.  Thank you for visiting [facility] today.

## 2023-09-25 ENCOUNTER — Ambulatory Visit
Admission: RE | Admit: 2023-09-25 | Discharge: 2023-09-25 | Disposition: A | Discharge status: Home / Self Care | Source: Ambulatory Visit | Attending: Sports Medicine | Admitting: Sports Medicine

## 2023-09-25 ENCOUNTER — Other Ambulatory Visit: Payer: Self-pay | Admitting: Sports Medicine

## 2023-09-25 ENCOUNTER — Ambulatory Visit: Admitting: Sports Medicine

## 2023-09-25 DIAGNOSIS — M48062 Spinal stenosis, lumbar region with neurogenic claudication: Secondary | ICD-10-CM

## 2023-09-25 DIAGNOSIS — M4727 Other spondylosis with radiculopathy, lumbosacral region: Secondary | ICD-10-CM | POA: Diagnosis not present

## 2023-09-25 MED ORDER — IOPAMIDOL (ISOVUE-M 200) INJECTION 41%
1.0000 mL | Freq: Once | INTRAMUSCULAR | Status: AC
Start: 2023-09-25 — End: 2023-09-25
  Administered 2023-09-25: 1 mL via EPIDURAL

## 2023-09-25 MED ORDER — METHYLPREDNISOLONE ACETATE 40 MG/ML INJ SUSP (RADIOLOG
80.0000 mg | Freq: Once | INTRAMUSCULAR | Status: AC
Start: 2023-09-25 — End: 2023-09-25
  Administered 2023-09-25: 80 mg via EPIDURAL

## 2023-09-28 IMAGING — RF DG UGI W/ HIGH DENSITY W/O KUB
5 series · 14 of 23 positions shown · non-contrast
Comparison: None.

CLINICAL DATA: Esophageal dysphagia. DYSPHAGIA R/O STRICTURE, R/O
ESOPHAGEAL DYSMOTILITY

EXAM:
UPPER GI SERIES WITH KUB
TECHNIQUE: After obtaining a scout radiograph a routine upper GI series was
performed using thin and high density barium.
FLUOROSCOPY TIME:  Fluoroscopy Time:  2 minute 0 seconds
Radiation Exposure Index (if provided by the fluoroscopic device):
29.7 mGy
Number of Acquired Spot Images: 14 images.  Multiple cine clips.

[Series 1: one shot · 0.14mm/px · 2 of 3 slices shown (1 of 3)]
[im 1/3]
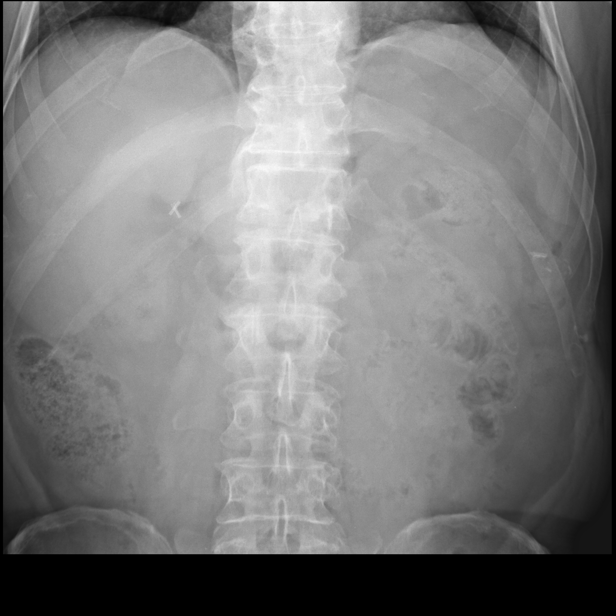
[im 3/3]
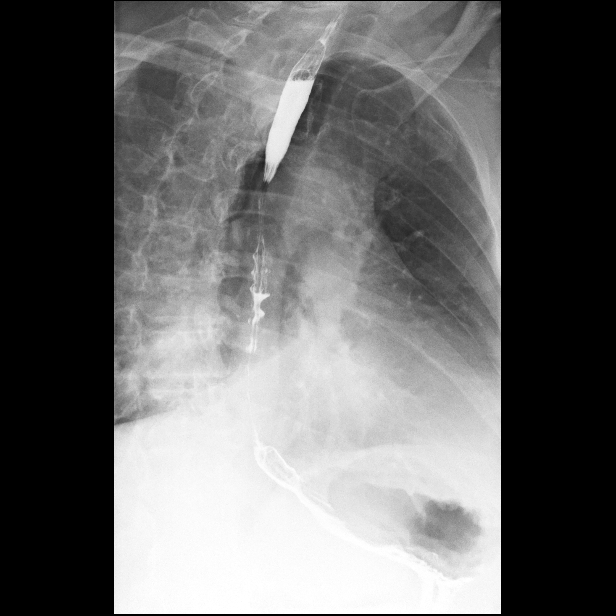

[Series 2: sequence · 2 of 18 frames shown (1 of 2)]
[frame 8/18]
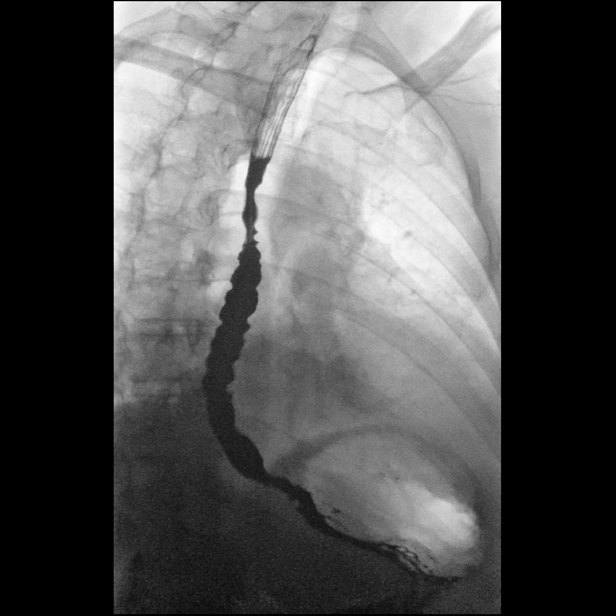
[frame 10/18]
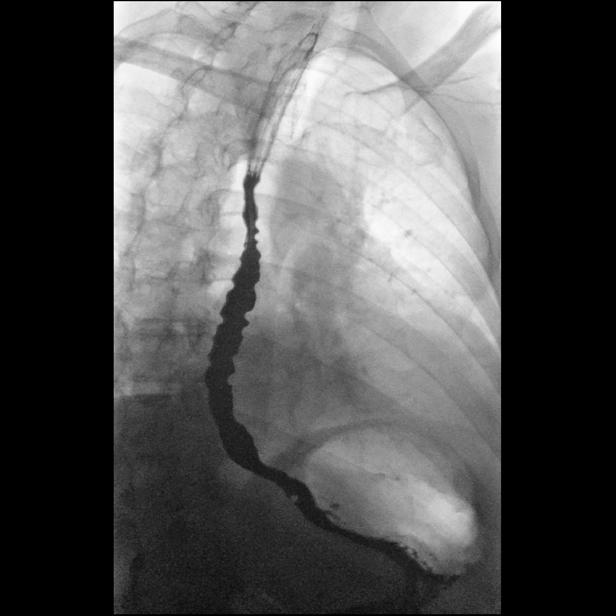

[Series 3: one shot · 0.15mm/px · 3 of 5 slices shown (2 of 3)]
[im 1/5]
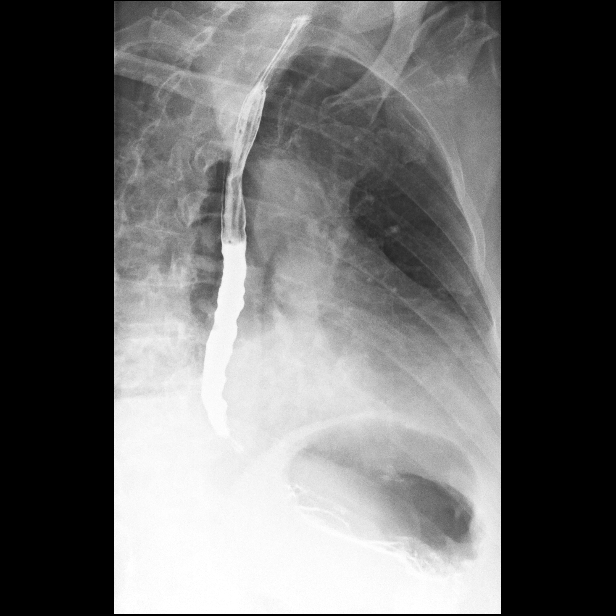
[im 3/5]
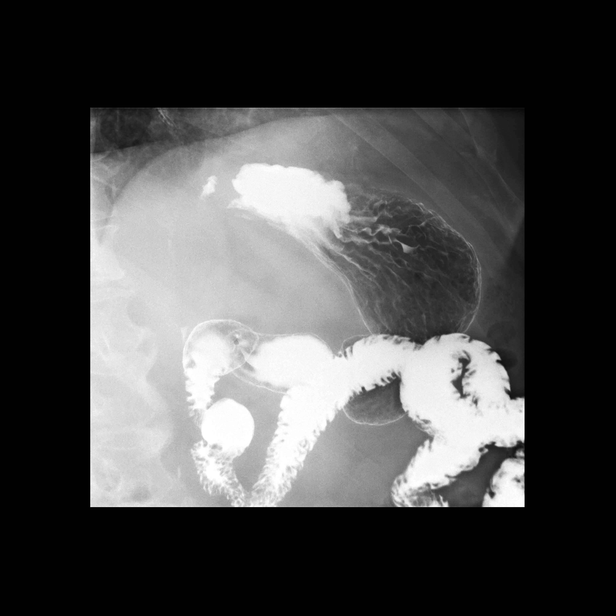
[im 4/5]
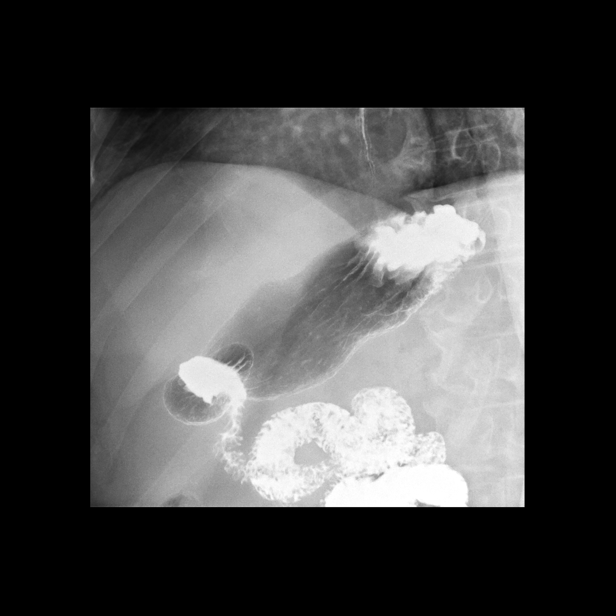

[Series 4: sequence · 3 of 51 frames shown (2 of 2)]
[frame 8/51]
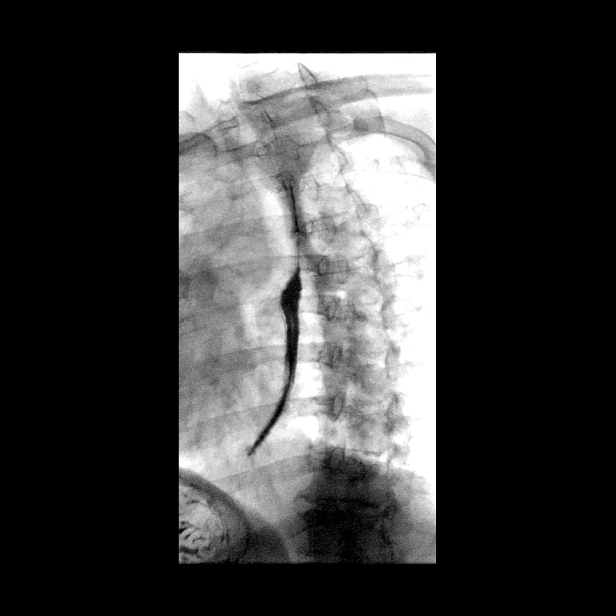
[frame 18/51]
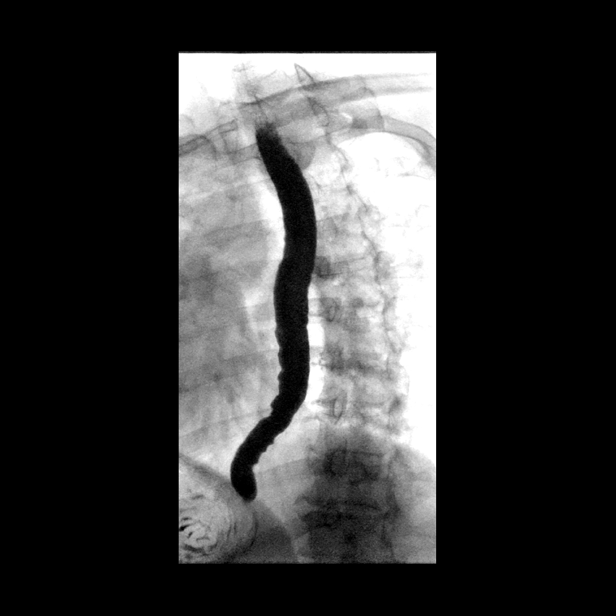
[frame 44/51]
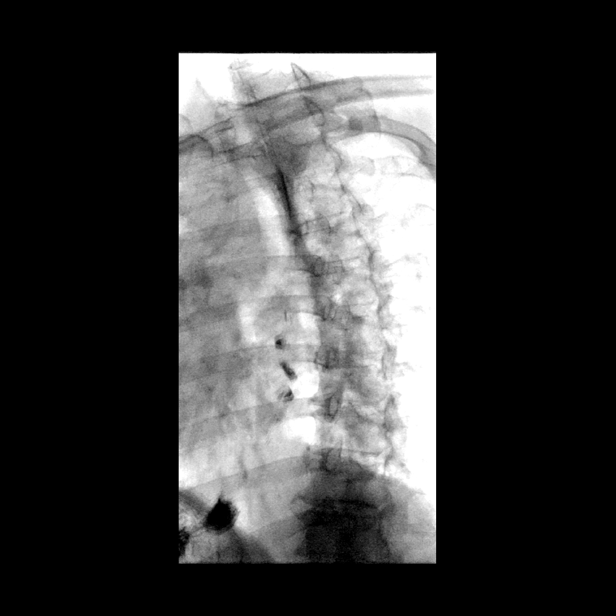

[Series 5: one shot · 0.15mm/px · 4 of 7 slices shown (3 of 3)]
[im 2/7]
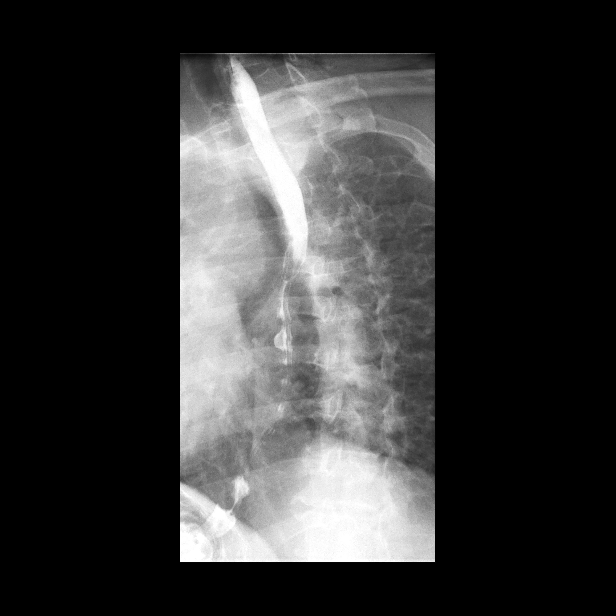
[im 3/7]
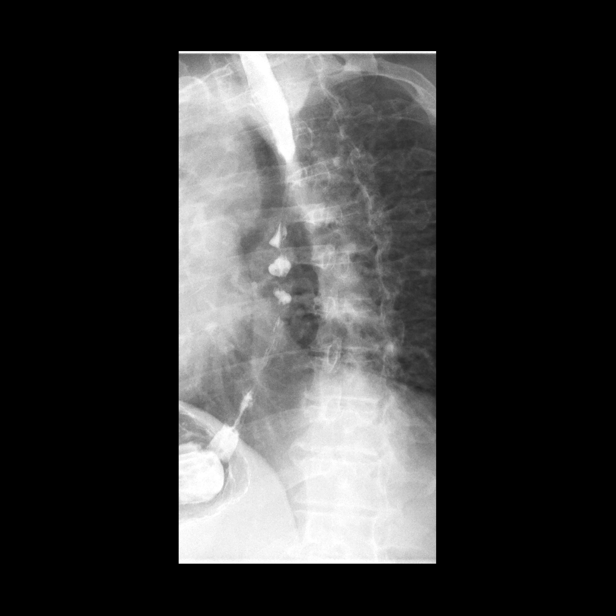
[im 5/7]
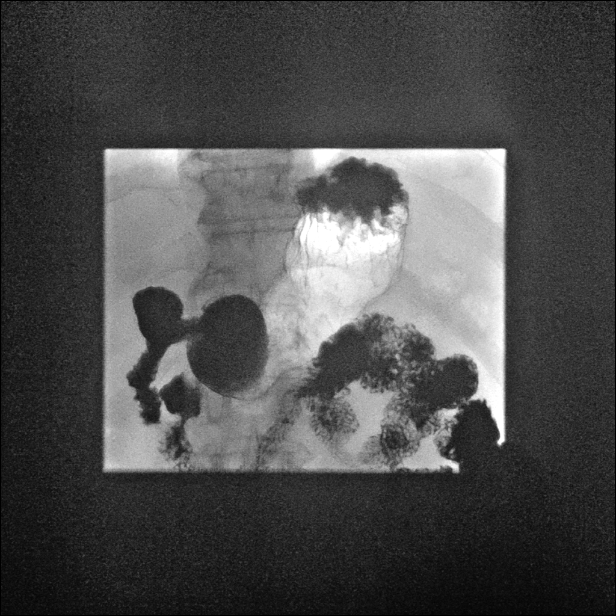
[im 7/7]
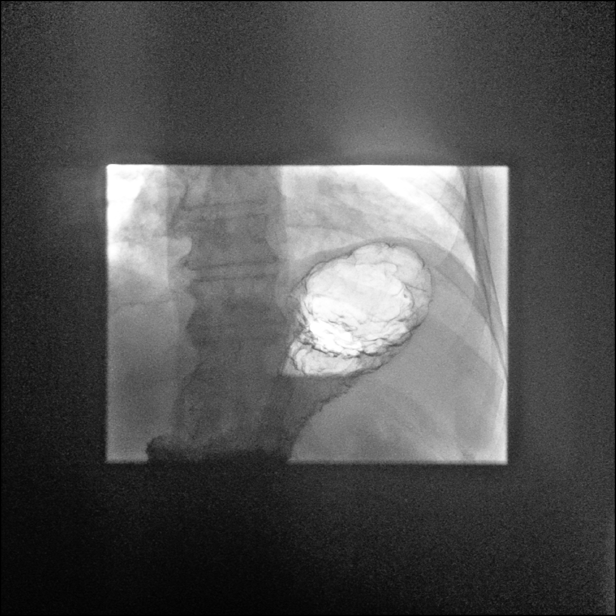

[14 of 23 positions shown; findings below may reference images not displayed]

FINDINGS: Scout radiograph demonstrates no evidence of bowel obstruction.
There are right upper quadrant surgical clips likely prior
cholecystectomy.

There is appears to be diffuse esophageal spasming, though without
delayed progression of contrast into the stomach. Contractions were
propulsive.

Small sliding-type hiatal hernia. Mild gastroesophageal reflux
occurred to the lower esophagus.

Normal morphology of the stomach without evidence of ulceration.
Normal duodenum. Normal position of the duodenal jejunal junction.

A 13 mm barium tablet passed into the stomach without difficulty.
IMPRESSION: Small sliding-type older hernia. Mild gastroesophageal reflux
occurred to the lower esophagus.

Diffuse esophageal spasming, though with normal propulsion of
contrast into the stomach.

No evidence of esophageal stricture.

Normal stomach and duodenum.

## 2023-10-03 ENCOUNTER — Ambulatory Visit (INDEPENDENT_AMBULATORY_CARE_PROVIDER_SITE_OTHER): Payer: Medicare HMO | Admitting: Physician Assistant

## 2023-10-03 ENCOUNTER — Encounter: Payer: Self-pay | Admitting: Physician Assistant

## 2023-10-03 VITALS — BP 122/60 | HR 60 | Ht 68.0 in | Wt 210.0 lb

## 2023-10-03 DIAGNOSIS — K580 Irritable bowel syndrome with diarrhea: Secondary | ICD-10-CM | POA: Diagnosis not present

## 2023-10-03 DIAGNOSIS — K219 Gastro-esophageal reflux disease without esophagitis: Secondary | ICD-10-CM | POA: Diagnosis not present

## 2023-10-03 MED ORDER — DICYCLOMINE HCL 10 MG PO CAPS: 10.0000 mg | ORAL_CAPSULE | Freq: Every morning | ORAL | 3 refills | Status: AC

## 2023-10-03 NOTE — Progress Notes (Signed)
 Chief Complaint: Follow-up diarrhea  HPI:    Alexander Glover is a 78 y/o male with a past medical history as listed below including fatty liver disease, signed to Dr. General Kenner at last visit, who returns to clinic today for follow-up of diarrhea.    11/01/2002 colonoscopy with diverticulosis and possible small polyp. Repeat recommended 10 years.     01/22/2022 colonoscopy done for personal history of colonic polyps by Dr. Lavaughn Portland with hemorrhoids, diverticulosis in the sigmoid and descending colon and otherwise normal.  Patient told to repeat as needed for screening purposes.    07/21/2023 patient seen in clinic by me and at that time described a "nervous stomach".  At that time gave him Dicyclomine  10 mg to take every morning and in between if he got a feeling of discomfort.  Also ordered labs including a CBC, CMP, TSH and celiac studies which were all negative/normal.  He was continued on Meprazole 20 mg daily    Today, patient presents to clinic accompanied by his wife and tells me that he is doing fabulous, he will typically wake up eat, drink his cup of coffee and have a couple bowel movements and then uses dicyclomine  and he is fine for the rest of the day.  He is very happy with how the medicine is working and has no other acute GI complaints or concerns.    Denies fever, chills or weight loss.  Past Medical History:  Diagnosis Date   BPH (benign prostatic hyperplasia) 09/11/2016   Fatty liver disease, nonalcoholic 09/11/2016   Hypercholesterolemia 09/11/2016   Hypertension    TGA (transient global amnesia) 09/04/2020    Past Surgical History:  Procedure Laterality Date   APPENDECTOMY     LAPAROSCOPIC CHOLECYSTECTOMY SINGLE PORT N/A 09/11/2016   Procedure: LAPAROSCOPIC CHOLECYSTECTOMY SINGLE SITE;  Surgeon: Candyce Champagne, MD;  Location: [facility];  Service: General;  Laterality: N/A;    Current Outpatient Medications  Medication Sig Dispense Refill   acetaminophen  (TYLENOL ) 500 MG tablet Take 2  tablets (1,000 mg total) by mouth every 8 (eight) hours. 30 tablet 0   amLODipine (NORVASC) 5 MG tablet Take 5 mg by mouth daily. 1/2 in the morning and 1 in the evening     aspirin  EC 81 MG tablet Take 81 mg by mouth daily.     atenolol-chlorthalidone (TENORETIC) 50-25 MG tablet Take 0.5 tablets by mouth daily.     Cranberry 425 MG CAPS Take by mouth.     dicyclomine  (BENTYL ) 10 MG capsule Take 1 capsule (10 mg total) by mouth in the morning. 30 capsule 5   gabapentin  (NEURONTIN ) 300 MG capsule Take 1 capsule (300 mg total) by mouth in the morning. 90 capsule 3   Garlic 500 MG CAPS Take by mouth.     Ginkgo Biloba (GINKOBA PO) Take by mouth.     irbesartan  (AVAPRO ) 300 MG tablet Take 300 mg by mouth daily.     Multiple Vitamin (MULTIVITAMIN) tablet Take 1 tablet by mouth daily.     Multiple Vitamins-Minerals (CORAL CALCIUM PLUS PO) Take by mouth.     Omega-3 Fatty Acids (FISH OIL) 1000 MG CAPS Take 1,500 mg by mouth daily.     potassium chloride  SA (K-DUR,KLOR-CON ) 20 MEQ tablet Take 1 tablet (20 mEq total) by mouth daily. 7 tablet 0   saccharomyces boulardii (FLORASTOR) 250 MG capsule You can buy this at any drug store.  You can use as directed for the next 2 weeks.  sildenafil (REVATIO) 20 MG tablet Take 20 mg by mouth 3 (three) times daily.     simvastatin  (ZOCOR ) 40 MG tablet Take 40 mg by mouth at bedtime.     tamsulosin  (FLOMAX ) 0.4 MG CAPS capsule Take 0.4 mg by mouth at bedtime.     triazolam  (HALCION ) 0.25 MG tablet 1-2 tabs PO 2 hours before procedure or imaging.  Do not drive with this medication. 2 tablet 0   vitamin C (ASCORBIC ACID) 500 MG tablet Take 500 mg by mouth daily.     zinc gluconate 50 MG tablet Take 50 mg by mouth daily.     gabapentin  (NEURONTIN ) 800 MG tablet Take 4 tablets (3,200 mg total) by mouth at bedtime. (Patient not taking: Reported on 10/03/2023) 360 tablet 3   No current facility-administered medications for this visit.    Allergies as of 10/03/2023 -  Review Complete 10/03/2023  Allergen Reaction Noted   Suvorexant  08/27/2022    Family History  Problem Relation Age of Onset   Diabetes Maternal Grandmother    Colon cancer Neg Hx    Stomach cancer Neg Hx    Esophageal cancer Neg Hx     Social History   Socioeconomic History   Marital status: Married    Spouse name: Dora   Number of children: 2   Years of education: Not on file   Highest education level: Not on file  Occupational History   Occupation: retired  Tobacco Use   Smoking status: Never   Smokeless tobacco: Never  Vaping Use   Vaping status: Never Used  Substance and Sexual Activity   Alcohol use: Never   Drug use: Not on file   Sexual activity: Not on file  Other Topics Concern   Not on file  Social History Narrative   Lives with wife and daughter   Right Handed   Drinks 1-2 cups caffeine daily   Social Drivers of Corporate investment banker Strain: Not on file  Food Insecurity: Not on file  Transportation Needs: Not on file  Physical Activity: Not on file  Stress: Not on file  Social Connections: Unknown (10/09/2021)   Received from [facility]   Social Network    Social Network: Not on file  Intimate Partner Violence: Unknown (08/31/2021)   Received from [employer], [facility]   HITS    Physically Hurt: Not on file    Insult or Talk Down To: Not on file    Threaten Physical Harm: Not on file    Scream or Curse: Not on file    Review of Systems:    Constitutional: No weight loss, fever or chills Cardiovascular: No chest pain  Respiratory: No SOB  Gastrointestinal: See HPI and otherwise negative   Physical Exam:  Vital signs: BP 122/60   Pulse 60   Ht 5\' 8"  (1.727 m)   Wt 210 lb (95.3 kg)   BMI 31.93 kg/m    Constitutional:   Pleasant Caucasian male appears to be in NAD, Well developed, Well nourished, alert and cooperative Respiratory: Respirations even and unlabored. Lungs clear to auscultation bilaterally.    No wheezes, crackles, or rhonchi.  Cardiovascular: Normal S1, S2. No MRG. Regular rate and rhythm. No peripheral edema, cyanosis or pallor.  Gastrointestinal:  Soft, nondistended, nontender. No rebound or guarding. Normal bowel sounds. No appreciable masses or hepatomegaly. Rectal:  Not performed.  Psychiatric: Oriented to person, place and time. Demonstrates good judgement and reason without abnormal affect  or behaviors.  RELEVANT LABS AND IMAGING: CBC    Component Value Date/Time   WBC 8.8 07/21/2023 1114   RBC 5.06 07/21/2023 1114   HGB 14.5 07/21/2023 1114   HCT 43.2 07/21/2023 1114   PLT 257.0 07/21/2023 1114   MCV 85.3 07/21/2023 1114   MCH 28.7 09/02/2017 0235   MCHC 33.5 07/21/2023 1114   RDW 13.1 07/21/2023 1114   LYMPHSABS 2.2 07/21/2023 1114   MONOABS 0.8 07/21/2023 1114   EOSABS 0.2 07/21/2023 1114   BASOSABS 0.1 07/21/2023 1114    CMP     Component Value Date/Time   NA 138 07/21/2023 1114   K 4.2 07/21/2023 1114   CL 100 07/21/2023 1114   CO2 26 07/21/2023 1114   GLUCOSE 121 (H) 07/21/2023 1114   BUN 19 07/21/2023 1114   CREATININE 1.13 07/21/2023 1114   CALCIUM 9.8 07/21/2023 1114   PROT 7.1 07/21/2023 1114   ALBUMIN 4.7 07/21/2023 1114   AST 40 (H) 07/21/2023 1114   ALT 34 07/21/2023 1114   ALKPHOS 60 07/21/2023 1114   BILITOT 0.9 07/21/2023 1114   GFRNONAA >60 09/02/2017 0235   GFRAA >60 09/02/2017 0235    Assessment: 1.  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  GERD: Controlled on Omeprazole 20 mg daily  Plan: 1.  Continue Dicyclomine  10 mg every morning, prescribe #90 with 3 refills 2.  Continue Omeprazole 20 mg daily 3.  Patient to follow in clinic with us  as needed.  Reginal Capra, PA-C [facility] 10/03/2023, 10:40 AM  Cc: Rae Bugler, MD

## 2023-10-03 NOTE — Progress Notes (Signed)
 Agree with assessment and plan as outlined.

## 2023-10-03 NOTE — Patient Instructions (Signed)
 We have sent the following medications to your pharmacy for you to pick up at your convenience: Dicyclomine 

## 2023-11-03 ENCOUNTER — Telehealth: Payer: Self-pay | Admitting: Sports Medicine

## 2023-11-03 ENCOUNTER — Other Ambulatory Visit

## 2023-11-03 ENCOUNTER — Other Ambulatory Visit: Payer: Self-pay | Admitting: Sports Medicine

## 2023-11-03 ENCOUNTER — Ambulatory Visit (INDEPENDENT_AMBULATORY_CARE_PROVIDER_SITE_OTHER): Admitting: Sports Medicine

## 2023-11-03 DIAGNOSIS — M48062 Spinal stenosis, lumbar region with neurogenic claudication: Secondary | ICD-10-CM | POA: Diagnosis not present

## 2023-11-03 MED ORDER — TRIAZOLAM 0.25 MG PO TABS: ORAL_TABLET | ORAL | 0 refills | Status: DC

## 2023-11-03 NOTE — Progress Notes (Signed)
    Procedures performed today:    None.  Independent interpretation of notes and tests performed by another provider:   None.  Brief History, Exam, Impression, and Recommendations:    Lumbar stenosis with neurogenic claudication Wm returns, he is a very pleasant 78 year old male, he has known severe lumbar spinal stenosis worst at L4-L5, multifactorial from facet arthropathy, ligamentum flavum hypertrophy and disc protrusion. Historically he has done well with epidurals over the past several years, bilateral transforaminals at L4-L5 have done well, he also is on high-dose gabapentin  which has been effective. He has done physical therapy in the past. Unfortunately the epidurals are no longer efficacious, he continues to have intolerable axial back pain with cramping and numbness in both calfs when walking consistent with neurogenic claudication. At this point I think he is a surgical candidate, I would like Dr. Jackee Marus to weigh in and we will going get an updated lumbar spine MRI.  For insurance coverage purposes the patient is failed over 6 weeks of physician directed physical therapy, he has had multiple epidurals, oral medications, the MRIs for surgical planning.  ____________________________________________ Joselyn Nicely. Sandy Crumb, M.D., ABFM., CAQSM., AME. Primary Care and Sports Medicine [facility]  Adjunct Professor of [employer]  [employer]  Restaurant manager, fast food

## 2023-11-03 NOTE — Assessment & Plan Note (Signed)
 Alexander Glover returns, he is a very pleasant 78 year old male, he has known severe lumbar spinal stenosis worst at L4-L5, multifactorial from facet arthropathy, ligamentum flavum hypertrophy and disc protrusion. Historically he has done well with epidurals over the past several years, bilateral transforaminals at L4-L5 have done well, he also is on high-dose gabapentin  which has been effective. He has done physical therapy in the past. Unfortunately the epidurals are no longer efficacious, he continues to have intolerable axial back pain with cramping and numbness in both calfs when walking consistent with neurogenic claudication. At this point I think he is a surgical candidate, I would like Alexander Glover to weigh in and we will going get an updated lumbar spine MRI.

## 2023-11-03 NOTE — Telephone Encounter (Signed)
 Pt is requesting a relaxing medication before his MRI today @ 5:30pm

## 2023-11-04 ENCOUNTER — Ambulatory Visit

## 2023-11-04 DIAGNOSIS — M48062 Spinal stenosis, lumbar region with neurogenic claudication: Secondary | ICD-10-CM | POA: Diagnosis not present

## 2023-11-04 DIAGNOSIS — M47816 Spondylosis without myelopathy or radiculopathy, lumbar region: Secondary | ICD-10-CM | POA: Diagnosis not present

## 2023-11-04 DIAGNOSIS — M5126 Other intervertebral disc displacement, lumbar region: Secondary | ICD-10-CM | POA: Diagnosis not present

## 2023-11-04 DIAGNOSIS — M48061 Spinal stenosis, lumbar region without neurogenic claudication: Secondary | ICD-10-CM | POA: Diagnosis not present

## 2023-11-04 DIAGNOSIS — R531 Weakness: Secondary | ICD-10-CM | POA: Diagnosis not present

## 2023-11-05 ENCOUNTER — Ambulatory Visit: Payer: Self-pay | Admitting: Sports Medicine

## 2023-11-05 NOTE — Telephone Encounter (Signed)
 Medication was sent to preferred pharmacy 11/03/2023 @15 :28 pm.

## 2023-11-13 DIAGNOSIS — M48062 Spinal stenosis, lumbar region with neurogenic claudication: Secondary | ICD-10-CM | POA: Diagnosis not present

## 2023-11-17 DIAGNOSIS — R7301 Impaired fasting glucose: Secondary | ICD-10-CM | POA: Diagnosis not present

## 2023-11-17 DIAGNOSIS — I10 Essential (primary) hypertension: Secondary | ICD-10-CM | POA: Diagnosis not present

## 2023-11-17 DIAGNOSIS — E7800 Pure hypercholesterolemia, unspecified: Secondary | ICD-10-CM | POA: Diagnosis not present

## 2023-12-04 DIAGNOSIS — R262 Difficulty in walking, not elsewhere classified: Secondary | ICD-10-CM | POA: Diagnosis not present

## 2023-12-04 DIAGNOSIS — M48062 Spinal stenosis, lumbar region with neurogenic claudication: Secondary | ICD-10-CM | POA: Diagnosis not present

## 2023-12-11 ENCOUNTER — Other Ambulatory Visit: Payer: Self-pay | Admitting: Sports Medicine

## 2023-12-11 DIAGNOSIS — M48062 Spinal stenosis, lumbar region with neurogenic claudication: Secondary | ICD-10-CM

## 2023-12-15 DIAGNOSIS — M5416 Radiculopathy, lumbar region: Secondary | ICD-10-CM | POA: Diagnosis not present

## 2023-12-15 DIAGNOSIS — M48062 Spinal stenosis, lumbar region with neurogenic claudication: Secondary | ICD-10-CM | POA: Diagnosis not present

## 2023-12-16 ENCOUNTER — Other Ambulatory Visit: Payer: Self-pay | Admitting: Orthopedic Surgery

## 2023-12-16 DIAGNOSIS — M546 Pain in thoracic spine: Secondary | ICD-10-CM

## 2023-12-17 ENCOUNTER — Encounter: Payer: Self-pay | Admitting: Orthopedic Surgery

## 2023-12-20 ENCOUNTER — Ambulatory Visit
Admission: RE | Admit: 2023-12-20 | Discharge: 2023-12-20 | Disposition: A | Discharge status: Home / Self Care | Source: Ambulatory Visit | Attending: Orthopedic Surgery | Admitting: Orthopedic Surgery

## 2023-12-20 DIAGNOSIS — M546 Pain in thoracic spine: Secondary | ICD-10-CM | POA: Diagnosis not present

## 2023-12-20 DIAGNOSIS — M4304 Spondylolysis, thoracic region: Secondary | ICD-10-CM | POA: Diagnosis not present

## 2023-12-23 ENCOUNTER — Other Ambulatory Visit: Payer: Self-pay | Admitting: Orthopedic Surgery

## 2023-12-23 DIAGNOSIS — M48062 Spinal stenosis, lumbar region with neurogenic claudication: Secondary | ICD-10-CM | POA: Diagnosis not present

## 2023-12-23 DIAGNOSIS — R262 Difficulty in walking, not elsewhere classified: Secondary | ICD-10-CM | POA: Diagnosis not present

## 2023-12-29 ENCOUNTER — Encounter: Payer: Self-pay | Admitting: Orthopedic Surgery

## 2023-12-29 ENCOUNTER — Other Ambulatory Visit: Payer: Self-pay | Admitting: Orthopedic Surgery

## 2023-12-29 DIAGNOSIS — M5416 Radiculopathy, lumbar region: Secondary | ICD-10-CM

## 2023-12-30 ENCOUNTER — Other Ambulatory Visit: Payer: Self-pay | Admitting: Sports Medicine

## 2023-12-30 DIAGNOSIS — M48062 Spinal stenosis, lumbar region with neurogenic claudication: Secondary | ICD-10-CM

## 2023-12-31 ENCOUNTER — Ambulatory Visit
Admission: RE | Admit: 2023-12-31 | Discharge: 2023-12-31 | Disposition: A | Discharge status: Home / Self Care | Source: Ambulatory Visit | Attending: Orthopedic Surgery | Admitting: Orthopedic Surgery

## 2023-12-31 DIAGNOSIS — M5116 Intervertebral disc disorders with radiculopathy, lumbar region: Secondary | ICD-10-CM | POA: Diagnosis not present

## 2023-12-31 DIAGNOSIS — M5416 Radiculopathy, lumbar region: Secondary | ICD-10-CM

## 2023-12-31 DIAGNOSIS — M4726 Other spondylosis with radiculopathy, lumbar region: Secondary | ICD-10-CM | POA: Diagnosis not present

## 2023-12-31 DIAGNOSIS — M48061 Spinal stenosis, lumbar region without neurogenic claudication: Secondary | ICD-10-CM | POA: Diagnosis not present

## 2024-01-02 NOTE — Progress Notes (Signed)
 Surgical Instructions   Your procedure is scheduled on Thursday, August 14th, 2025. Report to [facility] Main Entrance A at 10:00 A.M., then check in with the Admitting office. Any questions or running late day of surgery: call [phone]  Questions prior to your surgery date: call [phone], Monday-Friday, 8am-4pm. If you experience any cold or flu symptoms such as cough, fever, chills, shortness of breath, etc. between now and your scheduled surgery, please notify us  at the above number.     Remember:  Do not eat after midnight the night before your surgery  You may drink clear liquids until 10:00 the morning of your surgery.   Clear liquids allowed are: Water, Non-Citrus Juices (without pulp), Carbonated Beverages, Clear Tea (no milk, honey, etc.), Black Coffee Only (NO MILK, CREAM OR POWDERED CREAMER of any kind), and Gatorade.  Patient Instructions  The night before surgery:  No food after midnight. ONLY clear liquids after midnight  The day of surgery (if you do NOT have diabetes):  Drink ONE (1) Pre-Surgery Clear Ensure by 10:00 the morning of surgery. Drink in one sitting. Do not sip.  This drink was given to you during your hospital  pre-op appointment visit.  Nothing else to drink after completing the  Pre-Surgery Clear Ensure.           If you have questions, please contact your surgeon's office.     Take these medicines the morning of surgery with A SIP OF WATER: Dicyclomine  (Bentyl ) Atenolol-Chlorthalidone (Tenoretic) Gabapentin  (Neurontin ) Omeprazole (Prilosec)    May take these medicines IF NEEDED: None.   Follow your surgeon's instructions on when to stop your Aspirin .  If no instructions received, reach out to your surgeon's office.   One week prior to surgery, STOP taking any Aleve, Naproxen, Ibuprofen , Motrin , Advil , Goody's, BC's, all herbal medications, fish oil, and non-prescription vitamins.                     Do NOT Smoke  (Tobacco/Vaping) for 24 hours prior to your procedure.  If you use a CPAP at night, you may bring your mask/headgear for your overnight stay.   You will be asked to remove any contacts, glasses, piercing's, hearing aid's, dentures/partials prior to surgery. Please bring cases for these items if needed.    Patients discharged the day of surgery will not be allowed to drive home, and someone needs to stay with them for 24 hours.  SURGICAL WAITING ROOM VISITATION Patients may have no more than 2 support people in the waiting area - these visitors may rotate.   Pre-op nurse will coordinate an appropriate time for 1 ADULT support person, who may not rotate, to accompany patient in pre-op.  Children under the age of 41 must have an adult with them who is not the patient and must remain in the main waiting area with an adult.  If the patient needs to stay at the hospital during part of their recovery, the visitor guidelines for inpatient rooms apply.  Please refer to the [facility] website for the visitor guidelines for any additional information.   If you received a COVID test during your pre-op visit  it is requested that you wear a mask when out in public, stay away from anyone that may not be feeling well and notify your surgeon if you develop symptoms. If you have been in contact with anyone that has tested positive in the last 10 days please notify you surgeon.  Pre-operative 5 CHG Bathing Instructions   You can play a key role in reducing the risk of infection after surgery. Your skin needs to be as free of germs as possible. You can reduce the number of germs on your skin by washing with CHG (chlorhexidine gluconate) soap before surgery. CHG is an antiseptic soap that kills germs and continues to kill germs even after washing.   DO NOT use if you have an allergy to chlorhexidine/CHG or antibacterial soaps. If your skin becomes reddened or irritated, stop using the CHG and notify one  of our RNs at [phone].   Please shower with the CHG soap starting 4 days before surgery using the following schedule:     Please keep in mind the following:  DO NOT shave, including legs and underarms, starting the day of your first shower.   You may shave your face at any point before/day of surgery.  Place clean sheets on your bed the day you start using CHG soap. Use a clean washcloth (not used since being washed) for each shower. DO NOT sleep with pets once you start using the CHG.   CHG Shower Instructions:  Wash your face and private area with normal soap. If you choose to wash your hair, wash first with your normal shampoo.  After you use shampoo/soap, rinse your hair and body thoroughly to remove shampoo/soap residue.  Turn the water OFF and apply about 3 tablespoons (45 ml) of CHG soap to a CLEAN washcloth.  Apply CHG soap ONLY FROM YOUR NECK DOWN TO YOUR TOES (washing for 3-5 minutes)  DO NOT use CHG soap on face, private areas, open wounds, or sores.  Pay special attention to the area where your surgery is being performed.  If you are having back surgery, having someone wash your back for you may be helpful. Wait 2 minutes after CHG soap is applied, then you may rinse off the CHG soap.  Pat dry with a clean towel  Put on clean clothes/pajamas   If you choose to wear lotion, please use ONLY the CHG-compatible lotions that are listed below.  Additional instructions for the day of surgery: DO NOT APPLY any lotions, deodorants, cologne, or perfumes.   Do not bring valuables to the hospital. [facility] is not responsible for any belongings/valuables. Do not wear nail polish, gel polish, artificial nails, or any other type of covering on natural nails (fingers and toes) Do not wear jewelry or makeup Put on clean/comfortable clothes.  Please brush your teeth.  Ask your nurse before applying any prescription medications to the skin.     CHG Compatible Lotions   Aveeno  Moisturizing lotion  Cetaphil Moisturizing Cream  Cetaphil Moisturizing Lotion  Clairol Herbal Essence Moisturizing Lotion, Dry Skin  Clairol Herbal Essence Moisturizing Lotion, Extra Dry Skin  Clairol Herbal Essence Moisturizing Lotion, Normal Skin  Curel Age Defying Therapeutic Moisturizing Lotion with Alpha Hydroxy  Curel Extreme Care Body Lotion  Curel Soothing Hands Moisturizing Hand Lotion  Curel Therapeutic Moisturizing Cream, Fragrance-Free  Curel Therapeutic Moisturizing Lotion, Fragrance-Free  Curel Therapeutic Moisturizing Lotion, Original Formula  Eucerin Daily Replenishing Lotion  Eucerin Dry Skin Therapy Plus Alpha Hydroxy Crme  Eucerin Dry Skin Therapy Plus Alpha Hydroxy Lotion  Eucerin Original Crme  Eucerin Original Lotion  Eucerin Plus Crme Eucerin Plus Lotion  Eucerin TriLipid Replenishing Lotion  Keri Anti-Bacterial Hand Lotion  Keri Deep Conditioning Original Lotion Dry Skin Formula Softly Scented  Keri Deep Conditioning Original Lotion, Fragrance Free Sensitive  Skin Formula  Keri Lotion Fast Absorbing Fragrance Free Sensitive Skin Formula  Keri Lotion Fast Absorbing Softly Scented Dry Skin Formula  Keri Original Lotion  Keri Skin Renewal Lotion Keri Silky Smooth Lotion  Keri Silky Smooth Sensitive Skin Lotion  Nivea Body Creamy Conditioning Oil  Nivea Body Extra Enriched Lotion  Nivea Body Original Lotion  Nivea Body Sheer Moisturizing Lotion Nivea Crme  Nivea Skin Firming Lotion  NutraDerm 30 Skin Lotion  NutraDerm Skin Lotion  NutraDerm Therapeutic Skin Cream  NutraDerm Therapeutic Skin Lotion  ProShield Protective Hand Cream  Provon moisturizing lotion  Please read over the following fact sheets that you were given.

## 2024-01-03 ENCOUNTER — Other Ambulatory Visit: Payer: Self-pay | Admitting: Sports Medicine

## 2024-01-03 DIAGNOSIS — M48062 Spinal stenosis, lumbar region with neurogenic claudication: Secondary | ICD-10-CM

## 2024-01-05 ENCOUNTER — Encounter (HOSPITAL_COMMUNITY): Payer: Self-pay

## 2024-01-05 ENCOUNTER — Other Ambulatory Visit: Payer: Self-pay

## 2024-01-05 ENCOUNTER — Encounter (HOSPITAL_COMMUNITY)
Admission: RE | Admit: 2024-01-05 | Discharge: 2024-01-05 | Disposition: A | Discharge status: Home / Self Care | Source: Ambulatory Visit | Attending: Orthopedic Surgery | Admitting: Orthopedic Surgery

## 2024-01-05 VITALS — BP 148/74 | HR 68 | Temp 97.7°F | Resp 17 | Ht 68.0 in | Wt 209.0 lb

## 2024-01-05 DIAGNOSIS — G454 Transient global amnesia: Secondary | ICD-10-CM | POA: Insufficient documentation

## 2024-01-05 DIAGNOSIS — Z01812 Encounter for preprocedural laboratory examination: Secondary | ICD-10-CM | POA: Diagnosis present

## 2024-01-05 DIAGNOSIS — K760 Fatty (change of) liver, not elsewhere classified: Secondary | ICD-10-CM | POA: Insufficient documentation

## 2024-01-05 DIAGNOSIS — N400 Enlarged prostate without lower urinary tract symptoms: Secondary | ICD-10-CM | POA: Insufficient documentation

## 2024-01-05 DIAGNOSIS — I10 Essential (primary) hypertension: Secondary | ICD-10-CM | POA: Insufficient documentation

## 2024-01-05 DIAGNOSIS — E7800 Pure hypercholesterolemia, unspecified: Secondary | ICD-10-CM | POA: Diagnosis not present

## 2024-01-05 DIAGNOSIS — Z01818 Encounter for other preprocedural examination: Secondary | ICD-10-CM

## 2024-01-05 DIAGNOSIS — M5416 Radiculopathy, lumbar region: Secondary | ICD-10-CM | POA: Diagnosis not present

## 2024-01-05 LAB — SURGICAL PCR SCREEN
MRSA, PCR: NEGATIVE
Staphylococcus aureus: NEGATIVE

## 2024-01-05 LAB — BASIC METABOLIC PANEL WITH GFR
Anion gap: 13 (ref 5–15)
BUN: 19 mg/dL (ref 8–23)
CO2: 27 mmol/L (ref 22–32)
Calcium: 9.6 mg/dL (ref 8.9–10.3)
Chloride: 98 mmol/L (ref 98–111)
Creatinine, Ser: 1.16 mg/dL (ref 0.61–1.24)
GFR, Estimated: >60 mL/min (ref >60)
Glucose, Bld: 123 mg/dL — ABNORMAL HIGH (ref 70–99)
Potassium: 4.1 mmol/L (ref 3.5–5.1)
Sodium: 138 mmol/L (ref 135–145)

## 2024-01-05 LAB — CBC
HCT: 45 % (ref 39.0–52.0)
Hemoglobin: 14.6 g/dL (ref 13.0–17.0)
MCH: 28.2 pg (ref 26.0–34.0)
MCHC: 32.4 g/dL (ref 30.0–36.0)
MCV: 87 fL (ref 80.0–100.0)
Platelets: 267 K/uL (ref 150–400)
RBC: 5.17 MIL/uL (ref 4.22–5.81)
RDW: 12.9 % (ref 11.5–15.5)
WBC: 10.6 K/uL — ABNORMAL HIGH (ref 4.0–10.5)
nRBC: 0 % (ref 0.0–0.2)

## 2024-01-05 NOTE — Progress Notes (Signed)
 PCP - Dr. Charmaine Heller Cardiologist - Denies  PPM/ICD - Denies Device Orders - n/a Rep Notified - n/a  Chest x-ray - n/a EKG - 11-17-23 - [facility] - requested tracing from [facility] Dr. Heller office Stress Test - Denies ECHO - 04-12-20 Cardiac Cath - Denies  Sleep Study - Denies CPAP - n/a  NON-diabetic - patient has A1C checked every 6 mo to 1 year because at one time he was close to being a diabetic.   Last dose of GLP1 agonist-  Denies GLP1 instructions: n/a  Blood Thinner Instructions: Denies Aspirin  Instructions: Pt stopped taking it on August 3rd per doctor's order  ERAS Protcol - Clears until 1000 PRE-SURGERY Ensure or G2- Ensure  COVID TEST- n/a   Anesthesia review: Yes, requested EKG tracing for HTN  Patient denies shortness of breath, fever, cough and chest pain at PAT appointment. Patient denies any respiratory issues at this time.    All instructions explained to the patient, with a verbal understanding of the material. Patient agrees to go over the instructions while at home for a better understanding. Patient also instructed to self quarantine after being tested for COVID-19. The opportunity to ask questions was provided.

## 2024-01-06 ENCOUNTER — Encounter (HOSPITAL_COMMUNITY): Payer: Self-pay

## 2024-01-06 NOTE — Progress Notes (Signed)
 Anesthesia Chart Review:  Case: [id number] Date/Time: 01/08/24 1245   Procedure: LUMBAR LAMINECTOMY/DECOMPRESSION MICRODISCECTOMY 1 LEVEL - LUMBAR 4- LUMBAR 5 DECOMPRESSION   Anesthesia type: General   Pre-op diagnosis: LUMBAR RADICULOPATHY   Location: [facility] ROOM 05 / [facility]   Surgeons: Beuford Anes, MD       DISCUSSION: Patient is a 78 year old male scheduled for the above procedure.  History includes never smoker, HTN, hypercholesterolemia, transient global amnesia (04/11/20), nonalcoholic fatty liver disease, BPH, cholecystectomy (09/11/2016).  He established care with PCP Charmaine Heller, NP on 11/17/23 EKG and labs done given new patient and plans for back surgery. EKG showed SR, low voltage, rightward axis.  11/17/23 lab results included A1c 5.9%, Cr 1.28, eGFR 58, total bilirubin 0.7, alkaline phosphatase 77, AST 40, ALT 36.  She noted that prior to back pain, he was very active and walking 3 miles a day. 01/05/24 labs showed Na 138, K 4.1, glucose 123, BUN 19, Cr 1.16, Ca 9.6, WBC 10.6, H/H 14.6/45.0, PLT 267.  Anesthesia team to evaluate on the day of surgery. He reported last ASA 12/28/23.    VS: BP (!) 148/74   Pulse 68   Temp 36.5 C   Resp 17   Ht 5' 8 (1.727 m)   Wt 94.8 kg   SpO2 95%   BMI 31.78 kg/m   PROVIDERS: Heller Charmaine, NP is PCP [place])  LABS: Labs reviewed: Acceptable for surgery. (all labs ordered are listed, but only abnormal results are displayed)  Labs Reviewed  CBC - Abnormal; Notable for the following components:      Result Value   WBC 10.6 (*)    All other components within normal limits  BASIC METABOLIC PANEL WITH GFR - Abnormal; Notable for the following components:   Glucose, Bld 123 (*)    All other components within normal limits  SURGICAL PCR SCREEN     OTHER: EEG 04/12/20 ([facility]): Interpretation:  This is normal awake and drowsy EEG.  - Clinical Correlation: While a normal EEG does not rule out epilepsy, there was no evidence  of an underlying seizure disorder on this study.     CTA Head/Neck 04/11/20 ([facility]): IMPRESSION:  1. No acute arterial abnormality within the head or neck. No large vessel occlusion or significant stenosis.   MRI Head 04/11/20 ([facility]): IMPRESSION:  1. No acute intracranial infarction or hemorrhage.  2.  No contrast-enhancing lesions or vascular malformations are identified.    EKG: 11/17/2023 ([facility]; scanned under Media tab, Correspondence): Sinus rhythm.  Low voltage with rightward P axis and rotation, possible pulmonary disease.   CV: Echo 04/12/20 ([facility]): IMPRESSION: Left Atrium: Injection of agitated saline documents no interatrial  shunt..   Mitral Valve: There is mild regurgitation.    Left Ventricle: Systolic function is normal. EF: 60-65%.   Past Medical History:  Diagnosis Date   BPH (benign prostatic hyperplasia) 09/11/2016   Fatty liver disease, nonalcoholic 09/11/2016   Hypercholesterolemia 09/11/2016   Hypertension    TGA (transient global amnesia) 09/04/2020    Past Surgical History:  Procedure Laterality Date   APPENDECTOMY     LAPAROSCOPIC CHOLECYSTECTOMY SINGLE PORT N/A 09/11/2016   Procedure: LAPAROSCOPIC CHOLECYSTECTOMY SINGLE SITE;  Surgeon: Elspeth Schultze, MD;  Location: [facility];  Service: General;  Laterality: N/A;    MEDICATIONS:  amLODipine (NORVASC) 5 MG tablet   acetaminophen  (TYLENOL ) 500 MG tablet   aspirin  EC 81 MG tablet   atenolol-chlorthalidone (TENORETIC) 50-25 MG tablet   Cranberry  400 MG TABS   dicyclomine  (BENTYL ) 10 MG capsule   gabapentin  (NEURONTIN ) 300 MG capsule   gabapentin  (NEURONTIN ) 800 MG tablet   Ginkgo Biloba (GINKOBA PO)   irbesartan  (AVAPRO ) 300 MG tablet   MAGNESIUM  PO   Multiple Vitamin (MULTIVITAMIN) tablet   Omega-3 Fatty Acids (FISH OIL PO)   omeprazole (PRILOSEC) 40 MG capsule   OVER THE COUNTER MEDICATION   simvastatin  (ZOCOR ) 40 MG tablet   tamsulosin  (FLOMAX ) 0.4 MG CAPS capsule   vitamin C  (ASCORBIC ACID) 500 MG tablet   No current facility-administered medications for this encounter.    Isaiah Ruder, PA-C Surgical Short Stay/Anesthesiology [facility] Phone [phone] [place] Phone [phone] 01/06/2024 3:06 PM

## 2024-01-06 NOTE — Anesthesia Preprocedure Evaluation (Addendum)
 Anesthesia Evaluation  Patient identified by MRN, date of birth, ID band Patient awake    Reviewed: Allergy & Precautions, NPO status , Patient's Chart, lab work & pertinent test results  Airway Mallampati: IV  TM Distance: >3 FB Neck ROM: Full    Dental  (+) Dental Advisory Given   Pulmonary neg pulmonary ROS   breath sounds clear to auscultation       Cardiovascular hypertension, Pt. on medications  Rhythm:Regular Rate:Normal     Neuro/Psych  Neuromuscular disease    GI/Hepatic Neg liver ROS,GERD  Medicated,,  Endo/Other  negative endocrine ROS    Renal/GU negative Renal ROS     Musculoskeletal   Abdominal   Peds  Hematology negative hematology ROS (+)   Anesthesia Other Findings   Reproductive/Obstetrics                              Anesthesia Physical Anesthesia Plan  ASA: 2  Anesthesia Plan: General   Post-op Pain Management: Ofirmev  IV (intra-op)* and Precedex   Induction: Intravenous  PONV Risk Score and Plan: 2 and Dexamethasone , Ondansetron  and Treatment may vary due to age or medical condition  Airway Management Planned: Oral ETT  Additional Equipment: None  Intra-op Plan:   Post-operative Plan: Extubation in OR  Informed Consent: I have reviewed the patients History and Physical, chart, labs and discussed the procedure including the risks, benefits and alternatives for the proposed anesthesia with the patient or authorized representative who has indicated his/her understanding and acceptance.     Dental advisory given  Plan Discussed with: CRNA  Anesthesia Plan Comments: (  )         Anesthesia Quick Evaluation

## 2024-01-08 ENCOUNTER — Ambulatory Visit (HOSPITAL_COMMUNITY): Payer: Self-pay | Admitting: Vascular Surgery

## 2024-01-08 ENCOUNTER — Ambulatory Visit (HOSPITAL_COMMUNITY)

## 2024-01-08 ENCOUNTER — Ambulatory Visit (HOSPITAL_BASED_OUTPATIENT_CLINIC_OR_DEPARTMENT_OTHER): Admitting: Anesthesiology

## 2024-01-08 ENCOUNTER — Other Ambulatory Visit: Payer: Self-pay

## 2024-01-08 ENCOUNTER — Encounter (HOSPITAL_COMMUNITY)
Admission: RE | Disposition: A | Discharge status: Home / Self Care | Payer: Self-pay | Source: Home / Self Care | Attending: Orthopedic Surgery

## 2024-01-08 ENCOUNTER — Ambulatory Visit (HOSPITAL_COMMUNITY)
Admission: RE | Admit: 2024-01-08 | Discharge: 2024-01-08 | Disposition: A | Discharge status: Home / Self Care | Attending: Orthopedic Surgery | Admitting: Orthopedic Surgery

## 2024-01-08 DIAGNOSIS — I10 Essential (primary) hypertension: Secondary | ICD-10-CM | POA: Diagnosis not present

## 2024-01-08 DIAGNOSIS — K219 Gastro-esophageal reflux disease without esophagitis: Secondary | ICD-10-CM | POA: Diagnosis not present

## 2024-01-08 DIAGNOSIS — M5126 Other intervertebral disc displacement, lumbar region: Secondary | ICD-10-CM | POA: Insufficient documentation

## 2024-01-08 DIAGNOSIS — M48062 Spinal stenosis, lumbar region with neurogenic claudication: Secondary | ICD-10-CM | POA: Insufficient documentation

## 2024-01-08 DIAGNOSIS — Z01818 Encounter for other preprocedural examination: Secondary | ICD-10-CM | POA: Diagnosis not present

## 2024-01-08 DIAGNOSIS — Z79899 Other long term (current) drug therapy: Secondary | ICD-10-CM | POA: Diagnosis not present

## 2024-01-08 DIAGNOSIS — M5416 Radiculopathy, lumbar region: Secondary | ICD-10-CM

## 2024-01-08 HISTORY — PX: LUMBAR LAMINECTOMY/DECOMPRESSION MICRODISCECTOMY: SHX5026

## 2024-01-08 SURGERY — LUMBAR LAMINECTOMY/DECOMPRESSION MICRODISCECTOMY 1 LEVEL
Anesthesia: General

## 2024-01-08 MED ORDER — ACETAMINOPHEN 10 MG/ML IV SOLN
INTRAVENOUS | Status: AC
Start: 2024-01-08 — End: 2024-01-08
  Filled 2024-01-08: qty 100

## 2024-01-08 MED ORDER — CHLORHEXIDINE GLUCONATE 0.12 % MT SOLN
15.0000 mL | Freq: Once | OROMUCOSAL | Status: AC
  Administered 2024-01-08: 15 mL via OROMUCOSAL
  Filled 2024-01-08: qty 15

## 2024-01-08 MED ORDER — BUPIVACAINE-EPINEPHRINE 0.25% -1:200000 IJ SOLN
INTRAMUSCULAR | Status: DC | PRN
  Administered 2024-01-08: 8 mL

## 2024-01-08 MED ORDER — AMISULPRIDE (ANTIEMETIC) 5 MG/2ML IV SOLN: 10.0000 mg | Freq: Once | INTRAVENOUS | Status: DC | PRN

## 2024-01-08 MED ORDER — KETOROLAC TROMETHAMINE 0.5 % OP SOLN
OPHTHALMIC | Status: AC
  Filled 2024-01-08: qty 5

## 2024-01-08 MED ORDER — LIDOCAINE 2% (20 MG/ML) 5 ML SYRINGE
INTRAMUSCULAR | Status: AC
Start: 2024-01-08 — End: 2024-01-08
  Filled 2024-01-08: qty 5

## 2024-01-08 MED ORDER — PROPOFOL 10 MG/ML IV BOLUS
INTRAVENOUS | Status: AC
  Filled 2024-01-08: qty 20

## 2024-01-08 MED ORDER — ROCURONIUM BROMIDE 10 MG/ML (PF) SYRINGE
PREFILLED_SYRINGE | INTRAVENOUS | Status: DC | PRN
  Administered 2024-01-08 (×2): 10 mg via INTRAVENOUS
  Administered 2024-01-08: 60 mg via INTRAVENOUS

## 2024-01-08 MED ORDER — BUPIVACAINE-EPINEPHRINE (PF) 0.25% -1:200000 IJ SOLN
INTRAMUSCULAR | Status: DC | PRN
  Administered 2024-01-08: 40 mL

## 2024-01-08 MED ORDER — POVIDONE-IODINE 7.5 % EX SOLN: Freq: Once | CUTANEOUS | Status: AC

## 2024-01-08 MED ORDER — METHYLPREDNISOLONE ACETATE 40 MG/ML IJ SUSP
INTRAMUSCULAR | Status: DC | PRN
  Administered 2024-01-08: 1 mL

## 2024-01-08 MED ORDER — POLYMYXIN B-TRIMETHOPRIM 10000-0.1 UNIT/ML-% OP SOLN: 1.0000 [drp] | Freq: Four times a day (QID) | OPHTHALMIC | Status: DC

## 2024-01-08 MED ORDER — BUPIVACAINE LIPOSOME 1.3 % IJ SUSP
INTRAMUSCULAR | Status: AC
  Filled 2024-01-08: qty 20

## 2024-01-08 MED ORDER — SUGAMMADEX SODIUM 200 MG/2ML IV SOLN
INTRAVENOUS | Status: DC | PRN
  Administered 2024-01-08: 200 mg via INTRAVENOUS

## 2024-01-08 MED ORDER — LACTATED RINGERS IV SOLN: INTRAVENOUS | Status: DC

## 2024-01-08 MED ORDER — CEFAZOLIN SODIUM-DEXTROSE 2-4 GM/100ML-% IV SOLN
2.0000 g | INTRAVENOUS | Status: AC
  Administered 2024-01-08: 2 g via INTRAVENOUS
  Filled 2024-01-08: qty 100

## 2024-01-08 MED ORDER — OXYCODONE HCL 5 MG/5ML PO SOLN: 5.0000 mg | Freq: Once | ORAL | Status: DC | PRN

## 2024-01-08 MED ORDER — THROMBIN 20000 UNITS EX SOLR
CUTANEOUS | Status: AC
Start: 2024-01-08 — End: 2024-01-08
  Filled 2024-01-08: qty 20000

## 2024-01-08 MED ORDER — 0.9 % SODIUM CHLORIDE (POUR BTL) OPTIME
TOPICAL | Status: DC | PRN
  Administered 2024-01-08 (×2): 1000 mL

## 2024-01-08 MED ORDER — DEXAMETHASONE SODIUM PHOSPHATE 10 MG/ML IJ SOLN
INTRAMUSCULAR | Status: DC | PRN
  Administered 2024-01-08: 10 mg via INTRAVENOUS

## 2024-01-08 MED ORDER — LIDOCAINE 2% (20 MG/ML) 5 ML SYRINGE
INTRAMUSCULAR | Status: DC | PRN
  Administered 2024-01-08: 60 mg via INTRAVENOUS

## 2024-01-08 MED ORDER — KETOROLAC TROMETHAMINE 0.5 % OP SOLN
1.0000 [drp] | Freq: Four times a day (QID) | OPHTHALMIC | Status: DC
  Administered 2024-01-08: 1 [drp] via OPHTHALMIC

## 2024-01-08 MED ORDER — FENTANYL CITRATE (PF) 250 MCG/5ML IJ SOLN
INTRAMUSCULAR | Status: DC | PRN
  Administered 2024-01-08: 25 ug via INTRAVENOUS
  Administered 2024-01-08 (×2): 50 ug via INTRAVENOUS

## 2024-01-08 MED ORDER — PHENYLEPHRINE 80 MCG/ML (10ML) SYRINGE FOR IV PUSH (FOR BLOOD PRESSURE SUPPORT)
PREFILLED_SYRINGE | INTRAVENOUS | Status: AC
  Filled 2024-01-08: qty 10

## 2024-01-08 MED ORDER — ORAL CARE MOUTH RINSE: 15.0000 mL | Freq: Once | OROMUCOSAL | Status: AC

## 2024-01-08 MED ORDER — PHENYLEPHRINE HCL-NACL 20-0.9 MG/250ML-% IV SOLN
INTRAVENOUS | Status: DC | PRN
  Administered 2024-01-08: 35 ug/min via INTRAVENOUS

## 2024-01-08 MED ORDER — ONDANSETRON HCL 4 MG/2ML IJ SOLN
INTRAMUSCULAR | Status: DC | PRN
  Administered 2024-01-08: 4 mg via INTRAVENOUS

## 2024-01-08 MED ORDER — FENTANYL CITRATE (PF) 100 MCG/2ML IJ SOLN: 25.0000 ug | INTRAMUSCULAR | Status: DC | PRN

## 2024-01-08 MED ORDER — EPHEDRINE 5 MG/ML INJ
INTRAVENOUS | Status: AC
  Filled 2024-01-08: qty 5

## 2024-01-08 MED ORDER — PHENYLEPHRINE 80 MCG/ML (10ML) SYRINGE FOR IV PUSH (FOR BLOOD PRESSURE SUPPORT)
PREFILLED_SYRINGE | INTRAVENOUS | Status: DC | PRN
  Administered 2024-01-08 (×4): 80 ug via INTRAVENOUS

## 2024-01-08 MED ORDER — EPHEDRINE SULFATE-NACL 50-0.9 MG/10ML-% IV SOSY
PREFILLED_SYRINGE | INTRAVENOUS | Status: DC | PRN
  Administered 2024-01-08: 5 mg via INTRAVENOUS
  Administered 2024-01-08 (×2): 10 mg via INTRAVENOUS

## 2024-01-08 MED ORDER — OXYCODONE HCL 5 MG PO TABS: 5.0000 mg | ORAL_TABLET | Freq: Once | ORAL | Status: DC | PRN

## 2024-01-08 MED ORDER — ACETAMINOPHEN 10 MG/ML IV SOLN
INTRAVENOUS | Status: DC | PRN
Start: 2024-01-08 — End: 2024-01-08
  Administered 2024-01-08: 1000 mg via INTRAVENOUS

## 2024-01-08 MED ORDER — THROMBIN 20000 UNITS EX SOLR
CUTANEOUS | Status: DC | PRN
  Administered 2024-01-08: 20 mL via TOPICAL

## 2024-01-08 MED ORDER — BSS IO SOLN
15.0000 mL | Freq: Once | INTRAOCULAR | Status: AC
  Administered 2024-01-08: 15 mL

## 2024-01-08 MED ORDER — PROPOFOL 10 MG/ML IV BOLUS
INTRAVENOUS | Status: DC | PRN
  Administered 2024-01-08: 170 mg via INTRAVENOUS

## 2024-01-08 MED ORDER — METHYLPREDNISOLONE ACETATE 40 MG/ML IJ SUSP
INTRAMUSCULAR | Status: AC
  Filled 2024-01-08: qty 1

## 2024-01-08 MED ORDER — BUPIVACAINE-EPINEPHRINE (PF) 0.25% -1:200000 IJ SOLN
INTRAMUSCULAR | Status: AC
  Filled 2024-01-08: qty 30

## 2024-01-08 MED ORDER — ROCURONIUM BROMIDE 10 MG/ML (PF) SYRINGE
PREFILLED_SYRINGE | INTRAVENOUS | Status: AC
Start: 2024-01-08 — End: 2024-01-08
  Filled 2024-01-08: qty 10

## 2024-01-08 MED ORDER — FENTANYL CITRATE (PF) 250 MCG/5ML IJ SOLN
INTRAMUSCULAR | Status: AC
  Filled 2024-01-08: qty 5

## 2024-01-08 SURGICAL SUPPLY — 55 items
BAG COUNTER SPONGE SURGICOUNT (BAG) ×1 IMPLANT
BENZOIN TINCTURE PRP APPL 2/3 (GAUZE/BANDAGES/DRESSINGS) IMPLANT
BUR ROUND PRECISION 4.0 (BURR) ×1 IMPLANT
CABLE BIPOLOR RESECTION CORD (MISCELLANEOUS) ×1 IMPLANT
CANISTER SUCTION 3000ML PPV (SUCTIONS) ×1 IMPLANT
COVER SURGICAL LIGHT HANDLE (MISCELLANEOUS) ×1 IMPLANT
DRAPE POUCH INSTRU U-SHP 10X18 (DRAPES) ×2 IMPLANT
DRAPE SURG 17X23 STRL (DRAPES) ×4 IMPLANT
DURAPREP 26ML APPLICATOR (WOUND CARE) ×1 IMPLANT
ELECT CAUTERY BLADE 6.4 (BLADE) ×1 IMPLANT
ELECTRODE REM PT RTRN 9FT ADLT (ELECTROSURGICAL) ×1 IMPLANT
FILTER STRAW FLUID ASPIR (MISCELLANEOUS) ×1 IMPLANT
GAUZE 4X4 16PLY ~~LOC~~+RFID DBL (SPONGE) ×2 IMPLANT
GAUZE SPONGE 4X4 12PLY STRL (GAUZE/BANDAGES/DRESSINGS) ×1 IMPLANT
GLOVE BIO SURGEON STRL SZ 6.5 (GLOVE) ×1 IMPLANT
GLOVE BIO SURGEON STRL SZ8 (GLOVE) ×1 IMPLANT
GLOVE BIOGEL PI IND STRL 7.0 (GLOVE) ×1 IMPLANT
GLOVE BIOGEL PI IND STRL 8 (GLOVE) ×1 IMPLANT
GLOVE SURG ENC MOIS LTX SZ6.5 (GLOVE) ×1 IMPLANT
GOWN STRL REUS W/ TWL LRG LVL3 (GOWN DISPOSABLE) ×1 IMPLANT
GOWN STRL REUS W/ TWL XL LVL3 (GOWN DISPOSABLE) ×2 IMPLANT
IV CATH 14GX2 1/4 (CATHETERS) ×1 IMPLANT
IV CATH AUTO 14GX1.75 SAFE ORG (IV SOLUTION) IMPLANT
KIT BASIN OR (CUSTOM PROCEDURE TRAY) ×1 IMPLANT
KIT POSITIONER JACKSON TABLE (MISCELLANEOUS) ×1 IMPLANT
KIT TURNOVER KIT B (KITS) ×1 IMPLANT
NDL 18GX1X1/2 (RX/OR ONLY) (NEEDLE) ×1 IMPLANT
NDL 22X1.5 STRL (OR ONLY) (MISCELLANEOUS) ×1 IMPLANT
NDL HYPO 25GX1X1/2 BEV (NEEDLE) ×1 IMPLANT
NDL SPNL 18GX3.5 QUINCKE PK (NEEDLE) ×2 IMPLANT
NEEDLE 18GX1X1/2 (RX/OR ONLY) (NEEDLE) ×1 IMPLANT
NEEDLE 22X1.5 STRL (OR ONLY) (MISCELLANEOUS) ×1 IMPLANT
NEEDLE HYPO 25GX1X1/2 BEV (NEEDLE) ×1 IMPLANT
NEEDLE SPNL 18GX3.5 QUINCKE PK (NEEDLE) ×2 IMPLANT
NS IRRIG 1000ML POUR BTL (IV SOLUTION) ×1 IMPLANT
PACK LAMINECTOMY ORTHO (CUSTOM PROCEDURE TRAY) ×1 IMPLANT
PACK UNIVERSAL I (CUSTOM PROCEDURE TRAY) ×1 IMPLANT
PAD ARMBOARD POSITIONER FOAM (MISCELLANEOUS) ×2 IMPLANT
PATTIES SURGICAL .5 X.5 (GAUZE/BANDAGES/DRESSINGS) IMPLANT
SPONGE INTESTINAL PEANUT (DISPOSABLE) ×1 IMPLANT
SPONGE SURGIFOAM ABS GEL SZ50 (HEMOSTASIS) ×1 IMPLANT
STRIP CLOSURE SKIN 1/2X4 (GAUZE/BANDAGES/DRESSINGS) IMPLANT
SURGIFLO W/THROMBIN 8M KIT (HEMOSTASIS) IMPLANT
SUT MNCRL AB 4-0 PS2 18 (SUTURE) ×1 IMPLANT
SUT VIC AB 0 CT1 18XCR BRD 8 (SUTURE) IMPLANT
SUT VIC AB 1 CT1 18XCR BRD 8 (SUTURE) ×1 IMPLANT
SUT VIC AB 2-0 CT2 18 VCP726D (SUTURE) ×1 IMPLANT
SYR BULB IRRIG 60ML STRL (SYRINGE) ×1 IMPLANT
SYR CONTROL 10ML LL (SYRINGE) ×2 IMPLANT
SYR TB 1ML LUER SLIP (SYRINGE) ×4 IMPLANT
TAPE CLOTH SURG 4X10 WHT LF (GAUZE/BANDAGES/DRESSINGS) IMPLANT
TOWEL GREEN STERILE (TOWEL DISPOSABLE) ×1 IMPLANT
TOWEL GREEN STERILE FF (TOWEL DISPOSABLE) ×1 IMPLANT
WATER STERILE IRR 1000ML POUR (IV SOLUTION) ×1 IMPLANT
YANKAUER SUCT BULB TIP NO VENT (SUCTIONS) ×1 IMPLANT

## 2024-01-08 NOTE — H&P (Signed)
 PREOPERATIVE H&P  Chief Complaint: Bilateral leg pain  HPI: Alexander Glover is a 78 y.o. 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   04/11/20   Past Surgical History:  Procedure Laterality Date   APPENDECTOMY     LAPAROSCOPIC CHOLECYSTECTOMY SINGLE PORT N/A 09/11/2016   Procedure: LAPAROSCOPIC CHOLECYSTECTOMY SINGLE SITE;  Surgeon: Elspeth Schultze, MD;  Location: [facility];  Service: General;  Laterality: N/A;   Social History   Socioeconomic History   Marital status: Married    Spouse name: Alexander Glover   Number of children: 2   Years of education: Not on file   Highest education level: Not on file  Occupational History   Occupation: retired  Tobacco Use   Smoking status: Never   Smokeless tobacco: Never  Vaping Use   Vaping status: Never Used  Substance and Sexual Activity   Alcohol use: Never   Drug use: Never   Sexual activity: Not Currently  Other Topics Concern   Not on file  Social History Narrative   Lives with wife and daughter   Right Handed   Drinks 1-2 cups caffeine daily   Social Drivers of Health   Financial Resource Strain: Low Risk  (11/16/2023)   Received from [facility]   Overall Financial Resource Strain (CARDIA)    Difficulty of Paying Living Expenses: Not hard at all  Food Insecurity: No Food Insecurity (11/16/2023)   Received from [facility]   Hunger Vital Sign    Within the past 12 months, you worried that your food would run out before you got  the money to buy more.: Never true    Within the past 12 months, the food you bought just didn't last and you didn't have money to get more.: Never true  Transportation Needs: No Transportation Needs (11/16/2023)   Received from [facility] - Transportation    Lack of Transportation (Medical): No    Lack of Transportation (Non-Medical): No  Physical Activity: Sufficiently Active (11/16/2023)   Received from [facility]   Exercise Vital Sign    On average, how many days per week do you engage in moderate to strenuous exercise (like a brisk walk)?: 7 days    On average, how many minutes do you engage in exercise at this level?: 40 min  Stress: No Stress Concern Present (11/16/2023)   Received from [facility] - Occupational Stress Questionnaire    Feeling of Stress : Only a little  Social Connections: Moderately Integrated (11/16/2023)   Received from [facility]   Social Network    How would you rate your social network (family, work, friends)?: Adequate participation with social networks   Family History  Problem Relation Age of Onset   Diabetes Maternal Grandmother    Colon cancer Neg Hx    Stomach cancer Neg Hx    Esophageal cancer Neg Hx    Allergies  Allergen Reactions   Suvorexant     Other Reaction(s): leg myalgias  Prior to Admission medications   Medication Sig Start Date End Date Taking? Authorizing Provider  amLODipine (NORVASC) 5 MG tablet Take 5 mg by mouth at bedtime.   Yes [provider]  aspirin  EC 81 MG tablet Take 81 mg by mouth daily.   Yes [provider]  atenolol-chlorthalidone (TENORETIC) 50-25 MG tablet Take 0.5 tablets by mouth daily.   Yes [provider]  Cranberry 400 MG TABS Take 800 mg by mouth in the morning.   Yes [provider]  dicyclomine  (BENTYL ) 10 MG capsule Take 1 capsule (10 mg total) by mouth in the morning. 10/03/23  Yes Beather Delon Gibson,  PA  gabapentin  (NEURONTIN ) 300 MG capsule TAKE 1 CAPSULE (300 MG TOTAL) BY MOUTH IN THE MORNING. 12/30/23  Yes Curtis Debby PARAS, MD  gabapentin  (NEURONTIN ) 800 MG tablet 1 TABLET IN THE MORNING, 2 TABLETS NIGHTLY 01/05/24  Yes Curtis Debby PARAS, MD  Ginkgo Biloba (GINKOBA PO) Take 60 mg by mouth in the morning.   Yes [provider]  irbesartan  (AVAPRO ) 300 MG tablet Take 300 mg by mouth at bedtime.   Yes [provider]  MAGNESIUM  PO Take 500 mg by mouth daily.   Yes [provider]  Multiple Vitamin (MULTIVITAMIN) tablet Take 1 tablet by mouth daily.   Yes [provider]  Omega-3 Fatty Acids (FISH OIL PO) Take 1,500 mg by mouth daily.   Yes [provider]  omeprazole (PRILOSEC) 40 MG capsule Take 40 mg by mouth daily before breakfast.   Yes [provider]  OVER THE COUNTER MEDICATION Take 1,000 mg by mouth 2 (two) times daily. Glucogold Berberine 500  mg each   Yes [provider]  simvastatin  (ZOCOR ) 40 MG tablet Take 40 mg by mouth at bedtime.   Yes [provider]  tamsulosin  (FLOMAX ) 0.4 MG CAPS capsule Take 0.8 mg by mouth at bedtime.   Yes [provider]  vitamin C (ASCORBIC ACID) 500 MG tablet Take 500 mg by mouth daily.   Yes [provider]  acetaminophen  (TYLENOL ) 500 MG tablet Take 2 tablets (1,000 mg total) by mouth every 8 (eight) hours. Patient not taking: Reported on 12/31/2023 09/12/16   Tonnie George, PA-C     All other systems have been reviewed and were otherwise negative with the exception of those mentioned in the HPI and as above.  Physical Exam: Vitals:   01/08/24 0945  BP: (!) 149/72  Pulse: 74  Resp: 18  Temp: 97.6 F (36.4 C)  SpO2: 95%    Body mass index is 31.63 kg/m.  General: Alert, no acute distress Cardiovascular: No pedal edema Respiratory: No cyanosis, no use of accessory musculature Skin: No lesions in the area of chief complaint Neurologic:  Sensation intact distally Psychiatric: Patient is competent for consent with normal mood and affect Lymphatic: No axillary or cervical lymphadenopathy   Assessment/Plan:  Plan for Procedure(s): LUMBAR LAMINECTOMY/DECOMPRESSION MICRODISCECTOMY, L4-5   Oneil LITTIE Priestly, MD 01/08/2024 1:07 PM

## 2024-01-08 NOTE — Op Note (Signed)
 PATIENT NAME: Alexander Glover [facility]   MEDICAL RECORD NO.:   [id number]   DATE OF BIRTH: [birth year]   DATE OF PROCEDURE: 01/08/2024                               OPERATIVE REPORT   PREOPERATIVE DIAGNOSES: 1. Bilateral leg pain 2. Neurogenic claudication 3. Severe spinal stenosis, L4-5 4. Right-sided L4-5 disc herniation  POSTOPERATIVE DIAGNOSES: 1. Bilateral leg pain 2. Neurogenic claudication 3. Severe spinal stenosis, L4-5 4. Right-sided L4-5 disc herniation  PROCEDURE:  L4-5 laminectomy with bilateral partial facetectomy and bilateral foraminotomy, with removal of right L4-L5 disc herniation  SURGEON:  Oneil Priestly, MD.  ASSISTANTBETHA Ileana Clara, PA-C.  ANESTHESIA:  General endotracheal anesthesia.  COMPLICATIONS:  None.  DISPOSITION:  Stable.  ESTIMATED BLOOD LOSS:  Minimal.  INDICATIONS FOR SURGERY:  Briefly, Alexander Glover is a very pleasant 78- year-old male, who did present to me with pain in the bilateral legs. The patient's MRI did reveal spinal stenosis at L4-5.  He then had an increase in pain, and an updated MRI did reveal a disc herniation on the right at L4-5.  We did proceed with appropriate conservative treatment, but the patient did continue to have ongoing pain, which he did feel was limiting his function substantially.  Given the patient's ongoing pain and dysfunction, we did discuss proceeding with the procedure reflected above.  The patient was fully aware of the risks and limitations of surgery and did wish to proceed.  OPERATIVE DETAILS:  On 01/08/2024, the patient was brought to surgery and general endotracheal anesthesia was administered.  The patient was placed prone on a well-padded flat Jackson bed with a Wilson frame. Antibiotics were given and the back was prepped and draped in the usual sterile fashion.  A time-out procedure was performed.  I then made a midline incision overlying the L4-5 intervertebral space.  The fascia was incised at  the midline.  The paraspinal musculature was bluntly retracted laterally and held retracted with a self-retaining retractor. After confirming the appropriate operative level, I did remove the spinous process of L4.  At this point, I proceeded with a partial facetectomy on the right and left sides.  Of note, there was very substantial and very significant overgrowth of the facet joint bilaterally, and there was also very substantial hypertrophy of the ligamentum flavum.  This was causing very severe spinal stenosis.  The lateral recess stenosis was addressed by using Kerrison punches to thoroughly and decompress the right and left lateral recess.  At the termination of the decompression, I was able to pass a [facility] out the neuro foramen on the right and left sides at the L4-5 level.  I was able to easily pass the [facility] elevator medial to the L5 pedicles as well.  The spinal canal was entirely decompressed.  All bleeding was then adequately controlled.  At this point, 40 mg of Depo-Medrol  was introduced about the epidural space.  Prior to this, the wound was copiously irrigated with a total of approximately 2 L of normal saline. Gelfoam was placed over the laminectomy site.  I was very pleased with the decompression.  There was no extravasation of cerebrospinal fluid noted throughout the entire surgery.  At this point, the wound was closed in layers using #1 Vicryl, followed by 2-0 Vicryl, followed by 4- 0 Monocryl.  Benzoin and Steri-Strips were applied, followed by a sterile dressing.  All  instrument counts were correct at the termination of the procedure.  Of note, Ileana Clara, PA-C, was my assistant throughout surgery, and did aid in retraction, suctioning, and closure from start to finish.     Oneil Priestly, MD

## 2024-01-08 NOTE — Anesthesia Procedure Notes (Signed)
 Procedure Name: Intubation Date/Time: 01/08/2024 2:01 PM  Performed by: Atanacio Arland HERO, CRNAPre-anesthesia Checklist: Patient identified, Emergency Drugs available, Suction available and Patient being monitored Patient Re-evaluated:Patient Re-evaluated prior to induction Oxygen Delivery Method: Circle System Utilized Preoxygenation: Pre-oxygenation with 100% oxygen Induction Type: IV induction Ventilation: Mask ventilation without difficulty Laryngoscope Size: Glidescope and 3 Grade View: Grade I Tube type: Oral Tube size: 7.5 mm Number of attempts: 1 Airway Equipment and Method: Stylet Placement Confirmation: ETT inserted through vocal cords under direct vision, positive ETCO2 and breath sounds checked- equal and bilateral Secured at: 23 cm Tube secured with: Tape Dental Injury: Teeth and Oropharynx as per pre-operative assessment

## 2024-01-08 NOTE — Discharge Instructions (Signed)
 Ketorolac  tromethamine  ophthalmic solution: 1 drop to both eyes every 6 hours x 4 doses First dose given in PACU at 1730 next dose  12 midnight, 0600, 12 noon 01/09/2024

## 2024-01-08 NOTE — Transfer of Care (Signed)
 Immediate Anesthesia Transfer of Care Note  Patient: Alexander Glover  Procedure(s) Performed: LUMBAR LAMINECTOMY/DECOMPRESSION MICRODISCECTOMY 1 LEVEL  Patient Location: PACU  Anesthesia Type:General  Level of Consciousness: drowsy  Airway & Oxygen Therapy: Patient Spontanous Breathing, Patient connected to face mask oxygen, and oral airway  Post-op Assessment: Report given to RN and Post -op Vital signs reviewed and stable  Post vital signs: Reviewed and stable  Last Vitals:  Vitals Value Taken Time  BP 125/61   Temp 97.6   Pulse 81   Resp 19   SpO2 96     Last Pain:  Vitals:   01/08/24 1056  TempSrc:   PainSc: 0-No pain         Complications: No notable events documented.

## 2024-01-08 NOTE — Anesthesia Postprocedure Evaluation (Signed)
 Anesthesia Post Note  Patient: Alexander Glover  Procedure(s) Performed: LUMBAR LAMINECTOMY/DECOMPRESSION MICRODISCECTOMY 1 LEVEL     Patient location during evaluation: PACU Anesthesia Type: General Level of consciousness: awake and alert Pain management: pain level controlled Vital Signs Assessment: post-procedure vital signs reviewed and stable Respiratory status: spontaneous breathing, nonlabored ventilation, respiratory function stable and patient connected to nasal cannula oxygen Cardiovascular status: blood pressure returned to baseline and stable Postop Assessment: no apparent nausea or vomiting Anesthetic complications: no   No notable events documented.  Last Vitals:  Vitals:   01/08/24 1615 01/08/24 1630  BP: 125/61 139/68  Pulse: 80 78  Resp: 16 13  Temp: 36.4 C   SpO2: 95% 94%    Last Pain:  Vitals:   01/08/24 1615  TempSrc:   PainSc: 0-No pain                 Epifanio Lamar BRAVO

## 2024-01-09 ENCOUNTER — Encounter (HOSPITAL_COMMUNITY): Payer: Self-pay | Admitting: Orthopedic Surgery

## 2024-01-21 DIAGNOSIS — M1711 Unilateral primary osteoarthritis, right knee: Secondary | ICD-10-CM | POA: Diagnosis not present

## 2024-01-27 ENCOUNTER — Encounter: Payer: Self-pay | Admitting: Sports Medicine

## 2024-03-11 NOTE — Progress Notes (Signed)
 Alexander Glover                                          MRN: [id number]   03/11/2024   The [facility] Quality Team Specialist reviewed this patient medical record for the purposes of chart review for care gap closure. The following were reviewed: chart review for care gap closure-controlling blood pressure.    [facility] Quality Team

## 2024-03-15 DIAGNOSIS — M25561 Pain in right knee: Secondary | ICD-10-CM | POA: Diagnosis not present

## 2024-03-15 DIAGNOSIS — I10 Essential (primary) hypertension: Secondary | ICD-10-CM | POA: Diagnosis not present

## 2024-03-15 DIAGNOSIS — M25461 Effusion, right knee: Secondary | ICD-10-CM | POA: Diagnosis not present

## 2024-03-15 DIAGNOSIS — Z23 Encounter for immunization: Secondary | ICD-10-CM | POA: Diagnosis not present

## 2024-03-19 DIAGNOSIS — M1711 Unilateral primary osteoarthritis, right knee: Secondary | ICD-10-CM | POA: Diagnosis not present

## 2024-03-19 DIAGNOSIS — M25561 Pain in right knee: Secondary | ICD-10-CM | POA: Diagnosis not present

## 2024-03-19 DIAGNOSIS — I10 Essential (primary) hypertension: Secondary | ICD-10-CM | POA: Diagnosis not present

## 2024-03-19 DIAGNOSIS — M25461 Effusion, right knee: Secondary | ICD-10-CM | POA: Diagnosis not present

## 2024-03-31 DIAGNOSIS — L820 Inflamed seborrheic keratosis: Secondary | ICD-10-CM | POA: Diagnosis not present

## 2024-03-31 DIAGNOSIS — B351 Tinea unguium: Secondary | ICD-10-CM | POA: Diagnosis not present

## 2024-04-09 NOTE — Progress Notes (Signed)
 Alexander Glover                                          MRN: [id number]   04/09/2024   The [facility] Quality Team Specialist reviewed this patient medical record for the purposes of chart review for care gap closure. The following were reviewed: abstraction for care gap closure-controlling blood pressure.    [facility] Quality Team

## 2024-05-13 DIAGNOSIS — M1711 Unilateral primary osteoarthritis, right knee: Secondary | ICD-10-CM | POA: Diagnosis not present

## 2024-05-13 DIAGNOSIS — M25561 Pain in right knee: Secondary | ICD-10-CM | POA: Diagnosis not present

## 2024-05-13 DIAGNOSIS — R52 Pain, unspecified: Secondary | ICD-10-CM | POA: Diagnosis not present
